# Patient Record
Sex: Female | Born: 2000
Health system: Southern US, Community
[De-identification: ages and names within clinical notes are randomized; demographics above are authoritative.]

## PROBLEM LIST (undated history)

## (undated) DIAGNOSIS — N200 Calculus of kidney: Secondary | ICD-10-CM

## (undated) DIAGNOSIS — N946 Dysmenorrhea, unspecified: Secondary | ICD-10-CM

## (undated) HISTORY — PX: NO PAST SURGERIES: SHX2092

## (undated) HISTORY — DX: Dysmenorrhea, unspecified: N94.6

## (undated) HISTORY — DX: Calculus of kidney: N20.0

---
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
## 2021-02-13 ENCOUNTER — Other Ambulatory Visit: Payer: Self-pay | Admitting: Obstetrics and Gynecology

## 2021-02-13 DIAGNOSIS — N946 Dysmenorrhea, unspecified: Secondary | ICD-10-CM

## 2021-02-13 DIAGNOSIS — Z3041 Encounter for surveillance of contraceptive pills: Secondary | ICD-10-CM

## 2021-02-13 DIAGNOSIS — N921 Excessive and frequent menstruation with irregular cycle: Secondary | ICD-10-CM

## 2021-02-13 DIAGNOSIS — Z01419 Encounter for gynecological examination (general) (routine) without abnormal findings: Secondary | ICD-10-CM

## 2021-02-15 ENCOUNTER — Other Ambulatory Visit: Payer: Self-pay

## 2021-02-15 DIAGNOSIS — Z01419 Encounter for gynecological examination (general) (routine) without abnormal findings: Secondary | ICD-10-CM

## 2021-02-15 DIAGNOSIS — N946 Dysmenorrhea, unspecified: Secondary | ICD-10-CM

## 2021-02-15 DIAGNOSIS — Z3041 Encounter for surveillance of contraceptive pills: Secondary | ICD-10-CM

## 2021-02-15 DIAGNOSIS — N921 Excessive and frequent menstruation with irregular cycle: Secondary | ICD-10-CM

## 2021-02-15 MED ORDER — JUNEL FE 24 1-20 MG-MCG(24) PO TABS: 1.0000 | ORAL_TABLET | Freq: Every day | ORAL | 0 refills | Status: DC

## 2021-02-15 NOTE — Telephone Encounter (Signed)
Mom left msg on triage requesting BC RF be sent to pharmacy, annual scheduled already. [facility] RF sent, mom aware.

## 2021-04-08 DIAGNOSIS — M7522 Bicipital tendinitis, left shoulder: Secondary | ICD-10-CM | POA: Diagnosis not present

## 2021-04-17 ENCOUNTER — Encounter: Payer: Self-pay | Admitting: Obstetrics

## 2021-04-17 ENCOUNTER — Ambulatory Visit (INDEPENDENT_AMBULATORY_CARE_PROVIDER_SITE_OTHER): Payer: BC Managed Care – PPO | Admitting: Obstetrics

## 2021-04-17 ENCOUNTER — Telehealth: Payer: Self-pay | Admitting: Family

## 2021-04-17 ENCOUNTER — Other Ambulatory Visit: Payer: Self-pay

## 2021-04-17 VITALS — BP 110/70 | Ht 61.0 in | Wt 123.4 lb

## 2021-04-17 DIAGNOSIS — R300 Dysuria: Secondary | ICD-10-CM

## 2021-04-17 DIAGNOSIS — R399 Unspecified symptoms and signs involving the genitourinary system: Secondary | ICD-10-CM

## 2021-04-17 LAB — POCT URINALYSIS DIPSTICK
Bilirubin, UA: NEGATIVE
Blood, UA: POSITIVE
Glucose, UA: NEGATIVE
Protein, UA: NEGATIVE
Spec Grav, UA: >=1.03 — AB (ref 1.010–1.025)
Urobilinogen, UA: 1 E.U./dL
pH, UA: 5 (ref 5.0–8.0)

## 2021-04-17 MED ORDER — NITROFURANTOIN MONOHYD MACRO 100 MG PO CAPS: 100.0000 mg | ORAL_CAPSULE | Freq: Two times a day (BID) | ORAL | 0 refills | Status: AC

## 2021-04-17 NOTE — Telephone Encounter (Signed)
noted 

## 2021-04-17 NOTE — Progress Notes (Signed)
Obstetrics & Gynecology Office Visit   Chief Complaint:  Chief Complaint  Patient presents with  . Gynecologic Exam    Voiding blood     History of Present Illness: Michelle Barrett presents c/o UTI symptoms. She noticed blood in her urine this morning and dysuria. She had not had much water to drink yesterday, so she drank a large amount of H2O  Before her visit. She also c/o urgency. Recently finishe d her period but her vaginal flow has been dark brown. She is on OCPs.   Review of Systems:  Review of Systems  Genitourinary: Positive for dysuria, frequency, hematuria and urgency.  All other systems reviewed and are negative.    Past Medical History:  Past Medical History:  Diagnosis Date  . Dysmenorrhea in adolescent     Past Surgical History:  Past Surgical History:  Procedure Laterality Date  . NO PAST SURGERIES      Gynecologic History: Patient's last menstrual period was 04/10/2021.  Obstetric History: G0P0000  Family History:  Family History  Problem Relation Age of Onset  . Cancer Father        Sarcoma   . Cancer Paternal Uncle        Sarcoma  . Cancer Maternal Grandfather 60       Esophageal Cancer  . Colon cancer Neg Hx   . Breast cancer Neg Hx     Social History:  Social History   Socioeconomic History  . Marital status: Single    Spouse name: Not on file  . Number of children: Not on file  . Years of education: Not on file  . Highest education level: Not on file  Occupational History  . Not on file  Tobacco Use  . Smoking status: Never Smoker  . Smokeless tobacco: Never Used  Vaping Use  . Vaping Use: Never used  Substance and Sexual Activity  . Alcohol use: No  . Drug use: No  . Sexual activity: Yes    Birth control/protection: Pill  Other Topics Concern  . 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    Social Determinants of Health   Financial Resource Strain: Not on file  Food Insecurity: Not on file  Transportation Needs: Not on file  Physical Activity: Not on file  Stress: Not on file  Social Connections: Not on file  Intimate Partner Violence: Not on file    Allergies:  Allergies  Allergen Reactions  . Other Other (See Comments)    Peanuts (stomachache)    Medications: Prior to Admission medications   Medication Sig Start Date End Date Taking? Authorizing Provider  EPIDUO FORTE 0.3-2.5 % GEL Apply 1 application topically at bedtime. 08/10/20  Yes [provider]  Norethindrone Acetate-Ethinyl Estrad-FE (JUNEL FE 24) 1-20 MG-MCG(24) tablet Take 1 tablet by mouth daily. 02/15/21  Yes Conard Novak, MD  azithromycin (ZITHROMAX) 250 MG tablet Take 2 tablets ( total 500 mg) PO on day 1, then take 1 tablet ( total 250 mg) by mouth q24h x 4 days. Patient not taking: Reported on 04/17/2021 08/24/20   Theadore Nan, NP  nitrofurantoin, macrocrystal-monohydrate, (MACROBID) 100 MG capsule Take 1 capsule (100 mg total) by mouth 2 (two)  times daily for 5 days. 04/17/21 04/22/21 Yes Mirna Mires, CNM  spironolactone (ALDACTONE) 50 MG tablet Take 50 mg by mouth 2 (two) times daily. Patient not taking: Reported on 04/17/2021 08/09/20   [provider]    Physical Exam Vitals:  Vitals:   04/17/21 1454  BP: 110/70   Patient's last menstrual period was 04/10/2021.  Physical Exam Constitutional:      Appearance: Normal appearance.  HENT:     Head: Normocephalic and atraumatic.  Cardiovascular:     Rate and Rhythm: Regular rhythm.  Pulmonary:     Effort: Pulmonary effort is normal.     Breath sounds: Normal breath sounds.  Genitourinary:    Comments: deferred Musculoskeletal:        General: Normal range of motion.     Cervical back: Normal range of motion and neck supple.   Skin:    General: Skin is warm and dry.  Neurological:     General: No focal deficit present.     Mental Status: She is alert and oriented to person, place, and time.  Psychiatric:        Mood and Affect: Mood normal.    See urine dip- Large Leuks, + nitrites, + blood, Trace ketones Assessment: 20 y.o. G0P0000 with UTI symptoms; hematuria  Plan: Problem List Items Addressed This Visit   None   Visit Diagnoses    Dysuria    -  Primary   Relevant Medications   nitrofurantoin, macrocrystal-monohydrate, (MACROBID) 100 MG capsule   Other Relevant Orders   POCT Urinalysis Dipstick (Completed)   Urine Culture   UTI symptoms       Relevant Medications   nitrofurantoin, macrocrystal-monohydrate, (MACROBID) 100 MG capsule   Other Relevant Orders   Urine Culture    Will review the culture and change antibiotics if necessary.  Mirna Mires, CNM  04/17/2021 3:22 PM

## 2021-04-17 NOTE — Telephone Encounter (Signed)
FYI Patient was seen today at her OB/GYN for dysuria.

## 2021-04-17 NOTE — Telephone Encounter (Signed)
Sent to Access Nurse   Pt called she has blood in her urine that started today  Access Nurse aware to available appts today

## 2021-04-17 NOTE — Telephone Encounter (Signed)
Just FYI have seen & waiting on triage from access nurse.

## 2021-04-19 LAB — URINE CULTURE: Organism ID, Bacteria: NO GROWTH

## 2021-04-19 NOTE — Progress Notes (Signed)
Pt seen with c/o blood in her urine,Was finishing a period when she provided a CC sample. Culture shows no growth. Will be seeing her in several days for Annual, and can discuss then. Called the patient and left message with her mother that the culture did not grow out bacteria. She will stop the [employer]. May consult with urologist if she retests again. Mirna Mires, CNM  04/19/2021 10:10 AM

## 2021-04-20 ENCOUNTER — Emergency Department
Admission: EM | Admit: 2021-04-20 | Discharge: 2021-04-20 | Disposition: A | Discharge status: Home / Self Care | Payer: BC Managed Care – PPO | Attending: Emergency Medicine | Admitting: Emergency Medicine

## 2021-04-20 ENCOUNTER — Emergency Department: Payer: BC Managed Care – PPO

## 2021-04-20 ENCOUNTER — Other Ambulatory Visit: Payer: Self-pay

## 2021-04-20 ENCOUNTER — Encounter: Payer: Self-pay | Admitting: Emergency Medicine

## 2021-04-20 DIAGNOSIS — R102 Pelvic and perineal pain: Secondary | ICD-10-CM

## 2021-04-20 DIAGNOSIS — R319 Hematuria, unspecified: Secondary | ICD-10-CM | POA: Diagnosis not present

## 2021-04-20 DIAGNOSIS — R300 Dysuria: Secondary | ICD-10-CM | POA: Diagnosis not present

## 2021-04-20 DIAGNOSIS — N3289 Other specified disorders of bladder: Secondary | ICD-10-CM | POA: Diagnosis not present

## 2021-04-20 DIAGNOSIS — R109 Unspecified abdominal pain: Secondary | ICD-10-CM | POA: Diagnosis not present

## 2021-04-20 DIAGNOSIS — N3090 Cystitis, unspecified without hematuria: Secondary | ICD-10-CM | POA: Diagnosis not present

## 2021-04-20 DIAGNOSIS — B9689 Other specified bacterial agents as the cause of diseases classified elsewhere: Secondary | ICD-10-CM | POA: Diagnosis not present

## 2021-04-20 LAB — BASIC METABOLIC PANEL
Anion gap: 9 (ref 5–15)
BUN: 15 mg/dL (ref 6–20)
CO2: 24 mmol/L (ref 22–32)
Calcium: 9.5 mg/dL (ref 8.9–10.3)
Chloride: 104 mmol/L (ref 98–111)
Creatinine, Ser: 0.9 mg/dL (ref 0.44–1.00)
GFR, Estimated: >60 mL/min (ref >60)
Glucose, Bld: 109 mg/dL — ABNORMAL HIGH (ref 70–99)
Potassium: 3.8 mmol/L (ref 3.5–5.1)
Sodium: 137 mmol/L (ref 135–145)

## 2021-04-20 LAB — URINALYSIS, COMPLETE (UACMP) WITH MICROSCOPIC
Bilirubin Urine: NEGATIVE
Glucose, UA: NEGATIVE mg/dL
Hgb urine dipstick: NEGATIVE
Ketones, ur: 5 mg/dL — AB
Leukocytes,Ua: NEGATIVE
Nitrite: NEGATIVE
Protein, ur: NEGATIVE mg/dL
Specific Gravity, Urine: 1.008 (ref 1.005–1.030)
pH: 6 (ref 5.0–8.0)

## 2021-04-20 LAB — CBC
HCT: 39.5 % (ref 36.0–46.0)
Hemoglobin: 13.3 g/dL (ref 12.0–15.0)
MCH: 30.7 pg (ref 26.0–34.0)
MCHC: 33.7 g/dL (ref 30.0–36.0)
MCV: 91.2 fL (ref 80.0–100.0)
Platelets: 301 10*3/uL (ref 150–400)
RBC: 4.33 MIL/uL (ref 3.87–5.11)
RDW: 11.8 % (ref 11.5–15.5)
WBC: 10.5 10*3/uL (ref 4.0–10.5)
nRBC: 0 % (ref 0.0–0.2)

## 2021-04-20 LAB — HCG, QUANTITATIVE, PREGNANCY: hCG, Beta Chain, Quant, S: <1 m[IU]/mL (ref <5)

## 2021-04-20 MED ORDER — SODIUM CHLORIDE 0.9 % IV SOLN
1.0000 g | Freq: Once | INTRAVENOUS | Status: AC
  Administered 2021-04-20: 1 g via INTRAVENOUS
  Filled 2021-04-20: qty 10

## 2021-04-20 MED ORDER — OXYCODONE-ACETAMINOPHEN 5-325 MG PO TABS
1.0000 | ORAL_TABLET | Freq: Once | ORAL | Status: AC
  Administered 2021-04-20: 1 via ORAL
  Filled 2021-04-20: qty 1

## 2021-04-20 MED ORDER — ONDANSETRON 4 MG PO TBDP: 4.0000 mg | ORAL_TABLET | Freq: Four times a day (QID) | ORAL | 0 refills | Status: DC | PRN

## 2021-04-20 MED ORDER — IBUPROFEN 800 MG PO TABS: 800.0000 mg | ORAL_TABLET | Freq: Three times a day (TID) | ORAL | 0 refills | Status: DC | PRN

## 2021-04-20 MED ORDER — CEPHALEXIN 500 MG PO CAPS: 500.0000 mg | ORAL_CAPSULE | Freq: Two times a day (BID) | ORAL | 0 refills | Status: DC

## 2021-04-20 MED ORDER — ONDANSETRON 4 MG PO TBDP
4.0000 mg | ORAL_TABLET | Freq: Once | ORAL | Status: AC
  Administered 2021-04-20: 4 mg via ORAL
  Filled 2021-04-20: qty 1

## 2021-04-20 NOTE — ED Provider Notes (Addendum)
[facility] Emergency Department Provider Note  ____________________________________________   Event Date/Time   First MD Initiated Contact with Patient 04/20/21 [phone]     (approximate)  I have reviewed the triage vital signs and the nursing notes.   HISTORY  Chief Complaint Flank Pain    HPI Michelle Barrett is a 20 y.o. female with no significant past medical history who presents to the emergency department with complaints of dysuria, hematuria that started June 1.  Saw her OB/GYN in the next day and was started on Macrobid for possible UTI.  Was called today after her urine culture was negative and told to stop her antibiotic.  States she took 3 pills total.  Reports dysuria, hematuria improving but still having lower abdominal pain and now severe lower back pain.  No history of kidney stones but mother does have a history of stones and they were concerned that she could be having a kidney stone today.  No known fevers.  No nausea, vomiting or diarrhea.  No abnormal vaginal bleeding or discharge.  Last menstrual period was 04/10/2021 and was normal.  She was told previously that she could have endometriosis and was started on birth control at a young age for this.  Given oxycodone in triage and pain has significantly improved.  She denies history of STDs and declines testing today.  She denies any history of injury to her back.     Past Medical History:  Diagnosis Date  . Dysmenorrhea in adolescent     Patient Active Problem List   Diagnosis Date Noted  . URI with cough and congestion 08/24/2020  . Conjunctivitis 05/02/2020  . Rash 04/28/2018  . Sports physical 04/28/2018  . Menorrhagia with irregular cycle 01/24/2018  . Sore throat 12/30/2017  . Allergic rhinitis 11/26/2017  . Fatigue 04/07/2017  . Headache 01/19/2017  . Pharyngitis 04/18/2016  . Dysmenorrhea 04/18/2016  . Routine physical examination 05/05/2015  . Multiple food allergies  05/05/2015    Past Surgical History:  Procedure Laterality Date  . NO PAST SURGERIES      Prior to Admission medications   Medication Sig Start Date End Date Taking? Authorizing Provider  cephALEXin (KEFLEX) 500 MG capsule Take 1 capsule (500 mg total) by mouth 2 (two) times daily. 04/20/21  Yes Kashmir Lysaght, Layla Maw, DO  ibuprofen (ADVIL) 800 MG tablet Take 1 tablet (800 mg total) by mouth every 8 (eight) hours as needed for mild pain. 04/20/21  Yes Leightyn Cina N, DO  ondansetron (ZOFRAN ODT) 4 MG disintegrating tablet Take 1 tablet (4 mg total) by mouth every 6 (six) hours as needed for nausea or vomiting. 04/20/21  Yes Frantz Quattrone, Layla Maw, DO  azithromycin (ZITHROMAX) 250 MG tablet Take 2 tablets ( total 500 mg) PO on day 1, then take 1 tablet ( total 250 mg) by mouth q24h x 4 days. Patient not taking: Reported on 04/17/2021 08/24/20   Theadore Nan, NP  EPIDUO FORTE 0.3-2.5 % GEL Apply 1 application topically at bedtime. 08/10/20   [provider]  nitrofurantoin, macrocrystal-monohydrate, (MACROBID) 100 MG capsule Take 1 capsule (100 mg total) by mouth 2 (two) times daily for 5 days. 04/17/21 04/22/21  Mirna Mires, CNM  Norethindrone Acetate-Ethinyl Estrad-FE (JUNEL FE 24) 1-20 MG-MCG(24) tablet Take 1 tablet by mouth daily. 02/15/21   Conard Novak, MD  spironolactone (ALDACTONE) 50 MG tablet Take 50 mg by mouth 2 (two) times daily. Patient not taking: Reported on 04/17/2021 08/09/20  [provider]    Allergies Other  Family History  Problem Relation Age of Onset  . Cancer Father        Sarcoma   . Cancer Paternal Uncle        Sarcoma  . Cancer Maternal Grandfather 63       Esophageal Cancer  . Colon cancer Neg Hx   . Breast cancer Neg Hx     Social History Social History   Tobacco Use  . Smoking status: Never Smoker  . Smokeless tobacco: Never Used  Vaping Use  . Vaping Use: Never used  Substance Use Topics  . Alcohol use: No  . Drug use: No    Review  of Systems Constitutional: No fever. Eyes: No visual changes. ENT: No sore throat. Cardiovascular: Denies chest pain. Respiratory: Denies shortness of breath. Gastrointestinal: No nausea, vomiting, diarrhea. Genitourinary:+ for dysuria. Musculoskeletal: + for back pain. Skin: Negative for rash. Neurological: Negative for focal weakness or numbness.  ____________________________________________   PHYSICAL EXAM:  VITAL SIGNS: ED Triage Vitals  Enc Vitals Group     BP 04/20/21 0202 (!) 137/92     Pulse Rate 04/20/21 0202 90     Resp 04/20/21 0202 18     Temp 04/20/21 0202 98.4 F (36.9 C)     Temp Source 04/20/21 0202 Oral     SpO2 04/20/21 0202 96 %     Weight 04/20/21 0203 123 lb 6.4 oz (56 kg)     Height 04/20/21 0203 5\' 1"  (1.549 m)     Head Circumference --      Peak Flow --      Pain Score 04/20/21 0202 6     Pain Loc --      Pain Edu? --      Excl. in GC? --    CONSTITUTIONAL: Alert and oriented and responds appropriately to questions. Well-appearing; well-nourished HEAD: Normocephalic EYES: Conjunctivae clear, pupils appear equal, EOM appear intact ENT: normal nose; moist mucous membranes NECK: Supple, normal ROM CARD: RRR; S1 and S2 appreciated; no murmurs, no clicks, no rubs, no gallops RESP: Normal chest excursion without splinting or tachypnea; breath sounds clear and equal bilaterally; no wheezes, no rhonchi, no rales, no hypoxia or respiratory distress, speaking full sentences ABD/GI: Normal bowel sounds; non-distended; soft, mildly tender in the left pelvic region, no rebound, no guarding, no peritoneal signs, no hepatosplenomegaly, negative Murphy sign, no tenderness at McBurney's point, no tenderness in the right pelvic area GU: Patient declines BACK: The back appears normal, no midline spinal tenderness or step-off or deformity, no CVA tenderness EXT: Normal ROM in all joints; no deformity noted, no edema; no cyanosis SKIN: Normal color for age and race;  warm; no rash on exposed skin NEURO: Moves all extremities equally PSYCH: The patient's mood and manner are appropriate.  ____________________________________________   LABS (all labs ordered are listed, but only abnormal results are displayed)  Labs Reviewed  URINALYSIS, COMPLETE (UACMP) WITH MICROSCOPIC - Abnormal; Notable for the following components:      Result Value   Color, Urine YELLOW (*)    APPearance HAZY (*)    Ketones, ur 5 (*)    Bacteria, UA MANY (*)    All other components within normal limits  BASIC METABOLIC PANEL - Abnormal; Notable for the following components:   Glucose, Bld 109 (*)    All other components within normal limits  URINE CULTURE  CBC  HCG, QUANTITATIVE, PREGNANCY   ____________________________________________  EKG  ____________________________________________  RADIOLOGY Normajean BaxterI, Deshea Pooley, personally viewed and evaluated these images (plain radiographs) as part of my medical decision making, as well as reviewing the written report by the radiologist.  ED MD interpretation: Cystitis.  No kidney stone.  Appendix not visualized.  Official radiology report(s): CT Renal Stone Study  Result Date: 04/20/2021 CLINICAL DATA:  Flank pain. Kidney stone suspected. Hematuria few days ago. Gyn told her that she had a UTI. EXAM: CT ABDOMEN AND PELVIS WITHOUT CONTRAST TECHNIQUE: Multidetector CT imaging of the abdomen and pelvis was performed following the standard protocol without IV contrast. COMPARISON:  None. FINDINGS: Lower chest: No acute abnormality. Hepatobiliary: No focal liver abnormality. No gallstones, gallbladder wall thickening, or pericholecystic fluid. No biliary dilatation. Pancreas: No focal lesion. Normal pancreatic contour. No surrounding inflammatory changes. No main pancreatic ductal dilatation. Spleen: Normal in size without focal abnormality. Adrenals/Urinary Tract: No adrenal nodule bilaterally. 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 No nephrolithiasis, no hydronephrosis, and no contour-deforming renal mass. No definite ureterolithiasis or hydroureter. Mild circumferential urinary bladder wall thickening. Otherwise the urinary bladder is unremarkable. Stomach/Bowel: Stomach is within normal limits. No evidence of bowel wall thickening or dilatation. The appendix is not definitely identified. Vascular/Lymphatic: Likely phlebolith noted within the left pelvis (2:55). No significant vascular findings are present. No enlarged abdominal or pelvic lymph nodes. Reproductive: Uterus and bilateral adnexa are unremarkable. Other: No intraperitoneal free fluid. No intraperitoneal free gas. No organized fluid collection. Musculoskeletal: No abdominal wall hernia or abnormality. No suspicious lytic or blastic osseous lesions. No acute displaced fracture. Multilevel degenerative changes of the spine. IMPRESSION: 1. Mild circumferential urinary bladder wall thickening. Correlate with urinalysis for infection. 2. 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 No associated obstruction or reflux identified. Electronically Signed   By: Tish FredericksonMorgane  Naveau M.D.   On: 04/20/2021 04:04    ____________________________________________   PROCEDURES  Procedure(s) performed (including Critical Care):  Procedures    ____________________________________________   INITIAL IMPRESSION / ASSESSMENT AND PLAN / ED COURSE  As part of my medical decision making, I reviewed the following data within the electronic MEDICAL RECORD NUMBER History obtained from family, Nursing notes reviewed and incorporated, Labs reviewed , Old chart reviewed, Radiograph reviewed  and Notes from prior ED visits         Patient here with complaints of dysuria, hematuria and now lower back pain.  No CVA tenderness.  Urine shows many bacteria but no other sign of infection but this is a partially treated urine.  We will send urine culture today.  Labs  reassuring.  Pregnancy test negative.  CT scan shows no kidney stone but does show mild circumferential urinary bladder wall thickening consistent with cystitis.  I suspect that she may be developing an ascending UTI.  Will give Rocephin here.  She does have some left pelvic tenderness on exam however uterus and adnexa appear unremarkable on CT imaging.  We discussed that this could be endometriosis.  Have offered pelvic exam, STD screening today which she declines.  We discussed the possibility of ovarian cyst, torsion.  Family and patient comfortable with proceeding with transvaginal ultrasound.  She declines any further pain medicine at this time stating oxycodone in triage helped her significantly.  ED PROGRESS  Transvaginal ultrasound pending.  Signed out the oncoming ED physician.  Anticipate discharge home if no significant abnormality.  Will discharge with Keflex, ibuprofen, Zofran.   I reviewed all nursing notes and pertinent previous records as available.  I have reviewed and interpreted any EKGs, lab and urine results, imaging (as available).  ____________________________________________   FINAL CLINICAL IMPRESSION(S) / ED DIAGNOSES  Final diagnoses:  Pelvic pain  Cystitis     ED Discharge Orders         Ordered    cephALEXin (KEFLEX) 500 MG capsule  2 times daily        04/20/21 0712    ibuprofen (ADVIL) 800 MG tablet  Every 8 hours PRN        04/20/21 0712    ondansetron (ZOFRAN ODT) 4 MG disintegrating tablet  Every 6 hours PRN        04/20/21 8546          *Please note:  Maxwell Caul was evaluated in Emergency Department on 04/20/2021 for the symptoms described in the history of present illness. She was evaluated in the context of the global COVID-19 pandemic, which necessitated consideration that the patient might be at risk for infection with the SARS-CoV-2 virus that causes COVID-19. Institutional protocols and algorithms that pertain to the evaluation of  patients at risk for COVID-19 are in a state of rapid change based on information released by regulatory bodies including the CDC and federal and state organizations. These policies and algorithms were followed during the patient's care in the ED.  Some ED evaluations and interventions may be delayed as a result of limited staffing during and the pandemic.*   Note:  This document was prepared using Dragon voice recognition software and may include unintentional dictation errors.   Genoa Freyre, Layla Maw, DO 04/20/21 0720    Berle Fitz, Layla Maw, DO 04/20/21 2703

## 2021-04-20 NOTE — Discharge Instructions (Signed)
Your labs today were normal.  Your urine did show bacteria but no other sign of infection but this could be because you were recently on antibiotics.  Your CT scan was suggestive of inflammation of your bladder likely due to a urinary tract infection.  We did not see any kidney stones today.  We have sent a prescription of antibiotics to your pharmacy to take for the next week.  You may alternate Tylenol 1000 mg every 6 hours as needed for pain, fever and Ibuprofen 800 mg every 8 hours as needed for pain, fever.  Please take Ibuprofen with food.  Do not take more than 4000 mg of Tylenol (acetaminophen) in a 24 hour period.

## 2021-04-20 NOTE — ED Notes (Signed)
Ed room 25 dispo signature pad not working, pt unable to sign

## 2021-04-20 NOTE — ED Triage Notes (Signed)
Pt reports that she thought she had a UTI, she urinated blood a few days ago and went to her GYN and they told her that she had an UTI, they put her on antibiotics. They called her back and told her to stop taking the antibiotic because she did not have a UTI. She continues to have left sided flank pain and thinks she may have a kidney stone

## 2021-04-22 ENCOUNTER — Ambulatory Visit: Payer: BC Managed Care – PPO | Admitting: Obstetrics and Gynecology

## 2021-04-22 LAB — URINE CULTURE

## 2021-05-01 ENCOUNTER — Other Ambulatory Visit: Payer: Self-pay

## 2021-05-01 DIAGNOSIS — Z01419 Encounter for gynecological examination (general) (routine) without abnormal findings: Secondary | ICD-10-CM

## 2021-05-01 DIAGNOSIS — N946 Dysmenorrhea, unspecified: Secondary | ICD-10-CM

## 2021-05-01 DIAGNOSIS — N921 Excessive and frequent menstruation with irregular cycle: Secondary | ICD-10-CM

## 2021-05-01 DIAGNOSIS — Z3041 Encounter for surveillance of contraceptive pills: Secondary | ICD-10-CM

## 2021-05-01 MED ORDER — JUNEL FE 24 1-20 MG-MCG(24) PO TABS: 1.0000 | ORAL_TABLET | Freq: Every day | ORAL | 0 refills | Status: DC

## 2021-05-01 NOTE — Telephone Encounter (Signed)
Pt's mom calling; pt has scheduled appt 7/5 and needs bc refill 'til then.  [phone] Pt aware refill eRx'd.

## 2021-05-10 DIAGNOSIS — D2262 Melanocytic nevi of left upper limb, including shoulder: Secondary | ICD-10-CM | POA: Diagnosis not present

## 2021-05-10 DIAGNOSIS — L700 Acne vulgaris: Secondary | ICD-10-CM | POA: Diagnosis not present

## 2021-05-10 DIAGNOSIS — D2261 Melanocytic nevi of right upper limb, including shoulder: Secondary | ICD-10-CM | POA: Diagnosis not present

## 2021-05-10 DIAGNOSIS — D225 Melanocytic nevi of trunk: Secondary | ICD-10-CM | POA: Diagnosis not present

## 2021-05-14 ENCOUNTER — Ambulatory Visit: Payer: Self-pay | Admitting: Obstetrics and Gynecology

## 2021-05-15 ENCOUNTER — Ambulatory Visit: Payer: Self-pay | Admitting: Obstetrics and Gynecology

## 2021-05-21 ENCOUNTER — Other Ambulatory Visit: Payer: Self-pay

## 2021-05-21 ENCOUNTER — Ambulatory Visit (INDEPENDENT_AMBULATORY_CARE_PROVIDER_SITE_OTHER): Payer: BC Managed Care – PPO | Admitting: Obstetrics

## 2021-05-21 ENCOUNTER — Encounter: Payer: Self-pay | Admitting: Obstetrics

## 2021-05-21 VITALS — BP 100/64 | HR 64 | Ht 61.0 in | Wt 120.0 lb

## 2021-05-21 DIAGNOSIS — Z01419 Encounter for gynecological examination (general) (routine) without abnormal findings: Secondary | ICD-10-CM

## 2021-05-21 DIAGNOSIS — N921 Excessive and frequent menstruation with irregular cycle: Secondary | ICD-10-CM | POA: Diagnosis not present

## 2021-05-21 DIAGNOSIS — N946 Dysmenorrhea, unspecified: Secondary | ICD-10-CM

## 2021-05-21 DIAGNOSIS — Z3041 Encounter for surveillance of contraceptive pills: Secondary | ICD-10-CM | POA: Diagnosis not present

## 2021-05-21 MED ORDER — JUNEL FE 24 1-20 MG-MCG(24) PO TABS: 1.0000 | ORAL_TABLET | Freq: Every day | ORAL | 3 refills | Status: DC

## 2021-05-21 MED ORDER — IBUPROFEN 800 MG PO TABS: 800.0000 mg | ORAL_TABLET | Freq: Three times a day (TID) | ORAL | 6 refills | Status: DC | PRN

## 2021-05-21 NOTE — Progress Notes (Signed)
Gynecology Annual Exam  PCP: Allegra Grana, FNP  Chief Complaint:  Chief Complaint  Patient presents with   Annual Exam    History of Present Illness: Patient is a 20 y.o. G0P0000 presents for annual exam. The patient has no complaints today. She is a Consulting civil engineer at [employer]. Not presently sexually active. Her mother told her she needed a pap smear. Her mother is also opposed tp her receiving Gardasil.  LMP: Patient's last menstrual period was 05/08/2021. Menarche:13 Average Interval: regular, 28 days Duration of flow: 4 days Heavy Menses: no Clots: no Intermenstrual Bleeding: no Postcoital Bleeding: no Dysmenorrhea: yes  The patient is not currently sexually active. She currently uses OCP (estrogen/progesterone) for contraception. She denies dyspareunia.  The patient does not perform self breast exams.  There is no notable family history of breast or ovarian cancer in her family.  The patient wears seatbelts: yes.  The patient has regular exercise: yes.    The patient denies current symptoms of depression.    Review of Systems: Review of Systems  Constitutional: Negative.   HENT: Negative.    Eyes: Negative.   Respiratory: Negative.    Cardiovascular: Negative.   Gastrointestinal: Negative.   Genitourinary: Negative.   Musculoskeletal: Negative.   Skin: Negative.   Neurological: Negative.   Endo/Heme/Allergies: Negative.   Psychiatric/Behavioral: Negative.     Past Medical History:  Patient Active Problem List   Diagnosis Date Noted   URI with cough and congestion 08/24/2020   Conjunctivitis 05/02/2020   Rash 04/28/2018   Sports physical 04/28/2018   Menorrhagia with irregular cycle 01/24/2018   Sore throat 12/30/2017   Allergic rhinitis 11/26/2017   Fatigue 04/07/2017   Headache 01/19/2017   Pharyngitis 04/18/2016   Dysmenorrhea 04/18/2016   Routine physical examination 05/05/2015   Multiple food allergies 05/05/2015    Past Surgical History:  Past  Surgical History:  Procedure Laterality Date   NO PAST SURGERIES      Gynecologic History:  Patient's last menstrual period was 05/08/2021. Contraception: OCP (estrogen/progesterone) Last Pap: Results were: NA    Obstetric History: G0P0000  Family History:  Family History  Problem Relation Age of Onset   Cancer Father        Sarcoma    Cancer Paternal Uncle        Sarcoma   Cancer Maternal Grandfather 70       Esophageal Cancer   Colon cancer Neg Hx    Breast cancer Neg Hx     Social History:  Social History   Socioeconomic History   Marital status: Single    Spouse name: Not on file   Number of children: Not on file   Years of education: Not on file   Highest education level: Not on file  Occupational History   Not on file  Tobacco Use   Smoking status: Never   Smokeless tobacco: Never  Vaping Use   Vaping Use: Never used  Substance and Sexual Activity   Alcohol use: No   Drug use: No   Sexual activity: Yes    Birth control/protection: Pill  Other Topics Concern   Not on file  Social History Narrative   Enjoys cheering on 2 squads and school cheerleading    Right handed   Caffeine- no soda, tea or coffee occasionally          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    Social Determinants  of Health   Financial Resource Strain: Not on file  Food Insecurity: Not on file  Transportation Needs: Not on file  Physical Activity: Not on file  Stress: Not on file  Social Connections: Not on file  Intimate Partner Violence: Not on file    Allergies:  Allergies  Allergen Reactions   Other Other (See Comments)    Peanuts (stomachache)    Medications: Prior to Admission medications   Medication Sig Start Date End Date Taking? Authorizing Provider  EPIDUO FORTE 0.3-2.5 % GEL Apply 1 application topically at bedtime. 08/10/20  Yes [provider]  Norethindrone Acetate-Ethinyl Estrad-FE (JUNEL FE  24) 1-20 MG-MCG(24) tablet Take 1 tablet by mouth daily. 05/01/21  Yes Mirna Mires, CNM  cephALEXin (KEFLEX) 500 MG capsule Take 1 capsule (500 mg total) by mouth 2 (two) times daily. 04/20/21   Ward, Layla Maw, DO  ibuprofen (ADVIL) 800 MG tablet Take 1 tablet (800 mg total) by mouth every 8 (eight) hours as needed for mild pain. Patient not taking: Reported on 05/21/2021 04/20/21   Ward, Layla Maw, DO  ondansetron (ZOFRAN ODT) 4 MG disintegrating tablet Take 1 tablet (4 mg total) by mouth every 6 (six) hours as needed for nausea or vomiting. Patient not taking: Reported on 05/21/2021 04/20/21   Ward, Layla Maw, DO  spironolactone (ALDACTONE) 50 MG tablet Take 50 mg by mouth 2 (two) times daily. 08/09/20   [provider]    Physical Exam Vitals: Blood pressure 100/64, pulse 64, height 5\' 1"  (1.549 m), weight 120 lb (54.4 kg), last menstrual period 05/08/2021.  General: NAD HEENT: normocephalic, anicteric Thyroid: no enlargement, no palpable nodules Pulmonary: No increased work of breathing, CTAB Cardiovascular: RRR, distal pulses 2+ Breast: Breast symmetrical, no tenderness, no palpable nodules or masses, no skin or nipple retraction present, no nipple discharge.  No axillary or supraclavicular lymphadenopathy. Abdomen: NABS, soft, non-tender, non-distended.  Umbilicus without lesions.  No hepatomegaly, splenomegaly or masses palpable. No evidence of hernia  Genitourinary:  External: Normal external female genitalia.  Normal urethral meatus, normal deferred Extremities: no edema, erythema, or tenderness Neurologic: Grossly intact Psychiatric: mood appropriate, affect full  Female chaperone present for pelvic and breast  portions of the physical exam    Assessment: 20 y.o. G0P0000 routine annual exam Renewal of contraception. dysmenorrhea  Plan: Problem List Items Addressed This Visit   None Visit Diagnoses     Women's annual routine gynecological examination    -  Primary        1) 4) Gardasil Series discussed and if applicable offered to patient - Patient has not previously completed 3 shot series   2) STI screening  wasoffered and declined  3)  ASCCP guidelines and rational discussed.  Patient opts for  starting at age 24  screening interval  4) Contraception - the patient is currently using  OCP (estrogen/progesterone).  She is  accepting of this method, but would consider an IUD.  We discussed safe sex practices to reduce her furture risk of STI's.   Content to use the OCPs for birth control, but  still c/o dysmenorrhea. We discussed her having a trial of using active pille and skipping the placebos, and also taking Motrin every 6-8 hours over the first two days of her period. Should she not see an improvement in her cramping, then we would consider a n IUD. 5) Return in about 1 year (around 05/21/2022) for annual.   07/22/2022, CNM  05/21/2021 8:20 AM  [facility], [facility] 05/21/2021, 8:20 AM

## 2021-08-16 DIAGNOSIS — J069 Acute upper respiratory infection, unspecified: Secondary | ICD-10-CM | POA: Diagnosis not present

## 2021-08-16 DIAGNOSIS — R070 Pain in throat: Secondary | ICD-10-CM | POA: Diagnosis not present

## 2021-10-25 ENCOUNTER — Telehealth (INDEPENDENT_AMBULATORY_CARE_PROVIDER_SITE_OTHER): Payer: BC Managed Care – PPO | Admitting: Family

## 2021-10-25 ENCOUNTER — Other Ambulatory Visit: Payer: Self-pay

## 2021-10-25 ENCOUNTER — Encounter: Payer: Self-pay | Admitting: Family

## 2021-10-25 VITALS — Ht 61.5 in | Wt 128.0 lb

## 2021-10-25 DIAGNOSIS — F419 Anxiety disorder, unspecified: Secondary | ICD-10-CM

## 2021-10-25 MED ORDER — BUSPIRONE HCL 5 MG PO TABS: 5.0000 mg | ORAL_TABLET | Freq: Three times a day (TID) | ORAL | 1 refills | Status: DC | PRN

## 2021-10-25 NOTE — Assessment & Plan Note (Signed)
New, uncontrolled.  Occurs around testing in school.  Denies depression.  Agreed starting as needed medication to take prior to testing is appropriate.  I provided her with BuSpar 5 mg 3 times daily as needed.  She will let me know how she is doing

## 2021-10-25 NOTE — Patient Instructions (Signed)
Start BuSpar 5 mg to be taken 3 times a day as needed for anxiety for prior to test.  Please let me know if you need a letter from the office.  Generalized Anxiety Disorder, Adult Generalized anxiety disorder (GAD) is a mental health condition. Unlike normal worries, anxiety related to GAD is not triggered by a specific event. These worries do not fade or get better with time. GAD interferes with relationships, work, and school. GAD symptoms can vary from mild to severe. People with severe GAD can have intense waves of anxiety with physical symptoms that are similar to panic attacks. What are the causes? The exact cause of GAD is not known, but the following are believed to have an impact: Differences in natural brain chemicals. Genes passed down from parents to children. Differences in the way threats are perceived. Development and stress during childhood. Personality. What increases the risk? The following factors may make you more likely to develop this condition: Being female. Having a family history of anxiety disorders. Being very shy. Experiencing very stressful life events, such as the death of a loved one. Having a very stressful family environment. What are the signs or symptoms? People with GAD often worry excessively about many things in their lives, such as their health and family. Symptoms may also include: Mental and emotional symptoms: Worrying excessively about natural disasters. Fear of being late. Difficulty concentrating. Fears that others are judging your performance. Physical symptoms: Fatigue. Headaches, muscle tension, muscle twitches, trembling, or feeling shaky. Feeling like your heart is pounding or beating very fast. Feeling out of breath or like you cannot take a deep breath. Having trouble falling asleep or staying asleep, or experiencing restlessness. Sweating. Nausea, diarrhea, or irritable bowel syndrome (IBS). Behavioral symptoms: Experiencing  erratic moods or irritability. Avoidance of new situations. Avoidance of people. Extreme difficulty making decisions. How is this diagnosed? This condition is diagnosed based on your symptoms and medical history. You will also have a physical exam. Your health care provider may perform tests to rule out other possible causes of your symptoms. To be diagnosed with GAD, a person must have anxiety that: Is out of his or her control. Affects several different aspects of his or her life, such as work and relationships. Causes distress that makes him or her unable to take part in normal activities. Includes at least three symptoms of GAD, such as restlessness, fatigue, trouble concentrating, irritability, muscle tension, or sleep problems. Before your health care provider can confirm a diagnosis of GAD, these symptoms must be present more days than they are not, and they must last for 6 months or longer. How is this treated? This condition may be treated with: Medicine. Antidepressant medicine is usually prescribed for long-term daily control. Anti-anxiety medicines may be added in severe cases, especially when panic attacks occur. Talk therapy (psychotherapy). Certain types of talk therapy can be helpful in treating GAD by providing support, education, and guidance. Options include: Cognitive behavioral therapy (CBT). People learn coping skills and self-calming techniques to ease their physical symptoms. They learn to identify unrealistic thoughts and behaviors and to replace them with more appropriate thoughts and behaviors. Acceptance and commitment therapy (ACT). This treatment teaches people how to be mindful as a way to cope with unwanted thoughts and feelings. Biofeedback. This process trains you to manage your body's response (physiological response) through breathing techniques and relaxation methods. You will work with a therapist while machines are used to monitor your physical  symptoms. Stress management  techniques. These include yoga, meditation, and exercise. A mental health specialist can help determine which treatment is best for you. Some people see improvement with one type of therapy. However, other people require a combination of therapies. Follow these instructions at home: Lifestyle Maintain a consistent routine and schedule. Anticipate stressful situations. Create a plan and allow extra time to work with your plan. Practice stress management or self-calming techniques that you have learned from your therapist or your health care provider. Exercise regularly and spend time outdoors. Eat a healthy diet that includes plenty of vegetables, fruits, whole grains, low-fat dairy products, and lean protein. Do not eat a lot of foods that are high in fat, added sugar, or salt (sodium). Drink plenty of water. Avoid alcohol. Alcohol can increase anxiety. Avoid caffeine and certain over-the-counter cold medicines. These may make you feel worse. Ask your pharmacist which medicines to avoid. General instructions Take over-the-counter and prescription medicines only as told by your health care provider. Understand that you are likely to have setbacks. Accept this and be kind to yourself as you persist to take better care of yourself. Anticipate stressful situations. Create a plan and allow extra time to work with your plan. Recognize and accept your accomplishments, even if you judge them as small. Spend time with people who care about you. Keep all follow-up visits. This is important. Where to find more information [employer] of Mental Health: http://www.maynard.net/ Substance Abuse and Mental Health Services: SkateOasis.com.pt Contact a health care provider if: Your symptoms do not get better. Your symptoms get worse. You have signs of depression, such as: A persistently sad or irritable mood. Loss of enjoyment in activities that used to bring you joy. Change in  weight or eating. Changes in sleeping habits. Get help right away if: You have thoughts about hurting yourself or others. If you ever feel like you may hurt yourself or others, or have thoughts about taking your own life, get help right away. Go to your nearest emergency department or: Call your local emergency services (911 in the [place] Call a suicide crisis helpline, such as the National Suicide Prevention Lifeline at (843) 448-1325 or 988 in the [place] This is open 24 hours a day in the [place] Text the Crisis Text Line at (737)773-3364 (in the [place] Summary Generalized anxiety disorder (GAD) is a mental health condition that involves worry that is not triggered by a specific event. People with GAD often worry excessively about many things in their lives, such as their health and family. GAD may cause symptoms such as restlessness, trouble concentrating, sleep problems, frequent sweating, nausea, diarrhea, headaches, and trembling or muscle twitching. A mental health specialist can help determine which treatment is best for you. Some people see improvement with one type of therapy. However, other people require a combination of therapies. This information is not intended to replace advice given to you by your health care provider. Make sure you discuss any questions you have with your health care provider. Document Revised: 05/29/2021 Document Reviewed: 02/24/2021 Elsevier Patient Education  2022 [employer].

## 2021-10-25 NOTE — Progress Notes (Signed)
Virtual Visit via Video Note  I connected with@  on 10/25/21 at  2:00 PM EST by a video enabled telemedicine application and verified that I am speaking with the correct person using two identifiers.  Location patient: home Location provider:work  Persons participating in the virtual visit: patient, provider  I discussed the limitations of evaluation and management by telemedicine and the availability of in person appointments. The patient expressed understanding and agreed to proceed.   HPI: Acute visit  Complains of increased anxiety over the past couple of months, unchanged.  Worse when she has to go to testing center for tests and when testing in person . She is not feeling anxiety other times. She describes not being to focus,  hands are shaking when taking test.  She at [employer] , sophomore. Major is business and she is doing well in school.  Lives with roommates and no stress at home.  No depression. No si/hi Never been on medication for anxiety  She is working with disability center at school to move out of testing center.    ROS: See pertinent positives and negatives per HPI.    EXAM:  VITALS per patient if applicable: Ht 5' 1.5" (1.562 m)   Wt 128 lb (58.1 kg)   BMI 23.79 kg/m  BP Readings from Last 3 Encounters:  05/21/21 100/64  04/20/21 (!) 97/58  04/17/21 110/70   Wt Readings from Last 3 Encounters:  10/25/21 128 lb (58.1 kg) (51 %, Z= 0.01)*  05/21/21 120 lb (54.4 kg) (36 %, Z= -0.35)*  04/20/21 123 lb 6.4 oz (56 kg) (44 %, Z= -0.16)*   * Growth percentiles are based on CDC (Girls, 2-20 Years) data.    GENERAL: alert, oriented, appears well and in no acute distress  HEENT: atraumatic, conjunttiva clear, no obvious abnormalities on inspection of external nose and ears  NECK: normal movements of the head and neck  LUNGS: on inspection no signs of respiratory distress, breathing rate appears normal, no obvious gross SOB, gasping or wheezing  CV: no  obvious cyanosis  MS: moves all visible extremities without noticeable abnormality  PSYCH/NEURO: pleasant and cooperative, no obvious depression or anxiety, speech and thought processing grossly intact  ASSESSMENT AND PLAN:  Discussed the following assessment and plan:  Problem List Items Addressed This Visit       Other   Anxiety - Primary    New, uncontrolled.  Occurs around testing in school.  Denies depression.  Agreed starting as needed medication to take prior to testing is appropriate.  I provided her with BuSpar 5 mg 3 times daily as needed.  She will let me know how she is doing      Relevant Medications   busPIRone (BUSPAR) 5 MG tablet    -we discussed possible serious and likely etiologies, options for evaluation and workup, limitations of telemedicine visit vs in person visit, treatment, treatment risks and precautions. Pt prefers to treat via telemedicine empirically rather then risking or undertaking an in person visit at this moment.  .   I discussed the assessment and treatment plan with the patient. The patient was provided an opportunity to ask questions and all were answered. The patient agreed with the plan and demonstrated an understanding of the instructions.   The patient was advised to call back or seek an in-person evaluation if the symptoms worsen or if the condition fails to improve as anticipated.   Rennie Plowman, FNP

## 2021-11-22 ENCOUNTER — Telehealth (INDEPENDENT_AMBULATORY_CARE_PROVIDER_SITE_OTHER): Payer: BC Managed Care – PPO | Admitting: Family

## 2021-11-22 ENCOUNTER — Encounter: Payer: Self-pay | Admitting: Family

## 2021-11-22 VITALS — Ht 61.5 in

## 2021-11-22 DIAGNOSIS — J0100 Acute maxillary sinusitis, unspecified: Secondary | ICD-10-CM | POA: Diagnosis not present

## 2021-11-22 DIAGNOSIS — J329 Chronic sinusitis, unspecified: Secondary | ICD-10-CM | POA: Insufficient documentation

## 2021-11-22 MED ORDER — AMOXICILLIN-POT CLAVULANATE 875-125 MG PO TABS: 1.0000 | ORAL_TABLET | Freq: Two times a day (BID) | ORAL | 0 refills | Status: AC

## 2021-11-22 NOTE — Progress Notes (Signed)
Virtual Visit via Video Note  I connected with@  on 11/22/21 at  8:00 AM EST by a video enabled telemedicine application and verified that I am speaking with the correct person using two identifiers.  Location patient: home Location provider:work  Persons participating in the virtual visit: patient, provider  I discussed the limitations of evaluation and management by telemedicine and the availability of in person appointments. The patient expressed understanding and agreed to proceed.   HPI:  Complains of green thick nasal congestion, facial pressure x 2 weeks, improved.  One week ago endorses pressure in face  She was seen by dentist 2 days ago as bilateral sides of jaw were hurting, since improved. Endorses dull bilateral temporal HA. Not worse HA of life.  Reports no concern for oral infection and advised to see orthodontist as concerned retainer may be contributory . She had had muffled sensation in ears, since resolved.  She has never had fever during illness. No chills, vision changes, ear pain, ear drainage, sob, wheezing.   She is taking mucinex d , elderberry and zinc. Prn tylenol and advil with some relief.   Covid negative 3 days ago.   Recent travel overseas to [place] from 11/11/21-11/19/21.  She suspected she had flu due to myalgias at that time approx 2 weeks ago.   She didn't sleep well while sleeping and worried about sinus infection.     ROS: See pertinent positives and negatives per HPI.    EXAM:  VITALS per patient if applicable: Ht 5' 1.5" (1.562 m)    BMI 23.79 kg/m  BP Readings from Last 3 Encounters:  05/21/21 100/64  04/20/21 (!) 97/58  04/17/21 110/70   Wt Readings from Last 3 Encounters:  10/25/21 128 lb (58.1 kg) (51 %, Z= 0.01)*  05/21/21 120 lb (54.4 kg) (36 %, Z= -0.35)*  04/20/21 123 lb 6.4 oz (56 kg) (44 %, Z= -0.16)*   * Growth percentiles are based on CDC (Girls, 2-20 Years) data.    GENERAL: alert, oriented, appears well and in  no acute distress  HEENT: atraumatic, conjunttiva clear, no obvious abnormalities on inspection of external nose and ears  NECK: normal movements of the head and neck  LUNGS: on inspection no signs of respiratory distress, breathing rate appears normal, no obvious gross SOB, gasping or wheezing  CV: no obvious cyanosis  MS: moves all visible extremities without noticeable abnormality  PSYCH/NEURO: pleasant and cooperative, no obvious depression or anxiety, speech and thought processing grossly intact  ASSESSMENT AND PLAN:  Discussed the following assessment and plan:  Problem List Items Addressed This Visit       Respiratory   Sinusitis - Primary    Afebrile.  No acute respiratory distress.  Duration of symptoms 2 weeks.  Discussed likely viral initially it is likely developed secondary bacterial infection.  Advise reasonable to go ahead and start Augmentin.  Advise probiotics.  She verbalized understanding of all and will let me know how she is doing      Relevant Medications   pseudoephedrine-guaifenesin (MUCINEX D) 60-600 MG 12 hr tablet   amoxicillin-clavulanate (AUGMENTIN) 875-125 MG tablet    -we discussed possible serious and likely etiologies, options for evaluation and workup, limitations of telemedicine visit vs in person visit, treatment, treatment risks and precautions. Pt prefers to treat via telemedicine empirically rather then risking or undertaking an in person visit at this moment.  .   I discussed the assessment and treatment plan with the patient. The patient  was provided an opportunity to ask questions and all were answered. The patient agreed with the plan and demonstrated an understanding of the instructions.   The patient was advised to call back or seek an in-person evaluation if the symptoms worsen or if the condition fails to improve as anticipated.   Mable Paris, FNP

## 2021-11-22 NOTE — Patient Instructions (Signed)
Start augmentin Ensure to take probiotics while on antibiotics and also for 2 weeks after completion. This can either be by eating yogurt daily or taking a probiotic supplement over the counter such as Culturelle.It is important to re-colonize the gut with good bacteria and also to prevent any diarrheal infections associated with antibiotic use.   Nice to see you!   

## 2021-11-22 NOTE — Assessment & Plan Note (Signed)
Afebrile.  No acute respiratory distress.  Duration of symptoms 2 weeks.  Discussed likely viral initially it is likely developed secondary bacterial infection.  Advise reasonable to go ahead and start Augmentin.  Advise probiotics.  She verbalized understanding of all and will let me know how she is doing

## 2021-12-05 ENCOUNTER — Encounter: Payer: Self-pay | Admitting: Family

## 2021-12-16 NOTE — Telephone Encounter (Signed)
Pt mom is calling about previous my chart message sent. Pt need letter ASAP

## 2022-02-06 DIAGNOSIS — L700 Acne vulgaris: Secondary | ICD-10-CM | POA: Diagnosis not present

## 2022-04-30 DIAGNOSIS — J029 Acute pharyngitis, unspecified: Secondary | ICD-10-CM | POA: Diagnosis not present

## 2022-06-07 DIAGNOSIS — Z6822 Body mass index (BMI) 22.0-22.9, adult: Secondary | ICD-10-CM | POA: Diagnosis not present

## 2022-06-07 DIAGNOSIS — J020 Streptococcal pharyngitis: Secondary | ICD-10-CM | POA: Diagnosis not present

## 2022-06-26 DIAGNOSIS — M47817 Spondylosis without myelopathy or radiculopathy, lumbosacral region: Secondary | ICD-10-CM | POA: Diagnosis not present

## 2022-06-26 DIAGNOSIS — M2550 Pain in unspecified joint: Secondary | ICD-10-CM | POA: Diagnosis not present

## 2022-06-26 DIAGNOSIS — M9903 Segmental and somatic dysfunction of lumbar region: Secondary | ICD-10-CM | POA: Diagnosis not present

## 2022-06-26 DIAGNOSIS — M545 Low back pain, unspecified: Secondary | ICD-10-CM | POA: Diagnosis not present

## 2022-06-27 ENCOUNTER — Other Ambulatory Visit: Payer: Self-pay | Admitting: Obstetrics

## 2022-06-30 DIAGNOSIS — M2550 Pain in unspecified joint: Secondary | ICD-10-CM | POA: Diagnosis not present

## 2022-06-30 DIAGNOSIS — M545 Low back pain, unspecified: Secondary | ICD-10-CM | POA: Diagnosis not present

## 2022-06-30 DIAGNOSIS — M47817 Spondylosis without myelopathy or radiculopathy, lumbosacral region: Secondary | ICD-10-CM | POA: Diagnosis not present

## 2022-06-30 DIAGNOSIS — M9903 Segmental and somatic dysfunction of lumbar region: Secondary | ICD-10-CM | POA: Diagnosis not present

## 2022-07-01 DIAGNOSIS — M9903 Segmental and somatic dysfunction of lumbar region: Secondary | ICD-10-CM | POA: Diagnosis not present

## 2022-07-01 DIAGNOSIS — M545 Low back pain, unspecified: Secondary | ICD-10-CM | POA: Diagnosis not present

## 2022-07-01 DIAGNOSIS — M47817 Spondylosis without myelopathy or radiculopathy, lumbosacral region: Secondary | ICD-10-CM | POA: Diagnosis not present

## 2022-07-01 DIAGNOSIS — M2550 Pain in unspecified joint: Secondary | ICD-10-CM | POA: Diagnosis not present

## 2022-07-02 DIAGNOSIS — M545 Low back pain, unspecified: Secondary | ICD-10-CM | POA: Diagnosis not present

## 2022-07-02 DIAGNOSIS — M2550 Pain in unspecified joint: Secondary | ICD-10-CM | POA: Diagnosis not present

## 2022-07-02 DIAGNOSIS — M9903 Segmental and somatic dysfunction of lumbar region: Secondary | ICD-10-CM | POA: Diagnosis not present

## 2022-07-02 DIAGNOSIS — M47817 Spondylosis without myelopathy or radiculopathy, lumbosacral region: Secondary | ICD-10-CM | POA: Diagnosis not present

## 2022-07-07 DIAGNOSIS — M47817 Spondylosis without myelopathy or radiculopathy, lumbosacral region: Secondary | ICD-10-CM | POA: Diagnosis not present

## 2022-07-07 DIAGNOSIS — M9903 Segmental and somatic dysfunction of lumbar region: Secondary | ICD-10-CM | POA: Diagnosis not present

## 2022-07-07 DIAGNOSIS — M545 Low back pain, unspecified: Secondary | ICD-10-CM | POA: Diagnosis not present

## 2022-07-07 DIAGNOSIS — M2550 Pain in unspecified joint: Secondary | ICD-10-CM | POA: Diagnosis not present

## 2022-07-08 DIAGNOSIS — M5459 Other low back pain: Secondary | ICD-10-CM | POA: Diagnosis not present

## 2022-07-08 DIAGNOSIS — S39012A Strain of muscle, fascia and tendon of lower back, initial encounter: Secondary | ICD-10-CM | POA: Diagnosis not present

## 2022-08-13 DIAGNOSIS — Z0000 Encounter for general adult medical examination without abnormal findings: Secondary | ICD-10-CM | POA: Diagnosis not present

## 2022-08-29 DIAGNOSIS — D485 Neoplasm of uncertain behavior of skin: Secondary | ICD-10-CM | POA: Diagnosis not present

## 2022-08-29 DIAGNOSIS — D225 Melanocytic nevi of trunk: Secondary | ICD-10-CM | POA: Diagnosis not present

## 2022-09-17 DIAGNOSIS — D2370 Other benign neoplasm of skin of unspecified lower limb, including hip: Secondary | ICD-10-CM | POA: Diagnosis not present

## 2022-10-01 DIAGNOSIS — Z113 Encounter for screening for infections with a predominantly sexual mode of transmission: Secondary | ICD-10-CM | POA: Diagnosis not present

## 2022-10-01 DIAGNOSIS — N946 Dysmenorrhea, unspecified: Secondary | ICD-10-CM | POA: Diagnosis not present

## 2022-11-05 DIAGNOSIS — D2261 Melanocytic nevi of right upper limb, including shoulder: Secondary | ICD-10-CM | POA: Diagnosis not present

## 2022-11-05 DIAGNOSIS — L700 Acne vulgaris: Secondary | ICD-10-CM | POA: Diagnosis not present

## 2022-11-05 DIAGNOSIS — D2262 Melanocytic nevi of left upper limb, including shoulder: Secondary | ICD-10-CM | POA: Diagnosis not present

## 2022-11-05 DIAGNOSIS — D225 Melanocytic nevi of trunk: Secondary | ICD-10-CM | POA: Diagnosis not present

## 2023-06-01 IMAGING — CT CT RENAL STONE PROTOCOL
2 of 4 series · 16 of 46 positions shown, 18 images · non-contrast
Comparison: None.

CLINICAL DATA: Flank pain. Kidney stone suspected. Hematuria few
days ago. Gyn told her that she had a UTI.

EXAM:
CT ABDOMEN AND PELVIS WITHOUT CONTRAST
TECHNIQUE: Multidetector CT imaging of the abdomen and pelvis was performed
following the standard protocol without IV contrast.

[Series 2: stone full standard · axial · 0.83mm/px · z∈[-916,-530]mm · 13 of 85 slices shown, 15 images]
[im 4/85  soft-tissue]
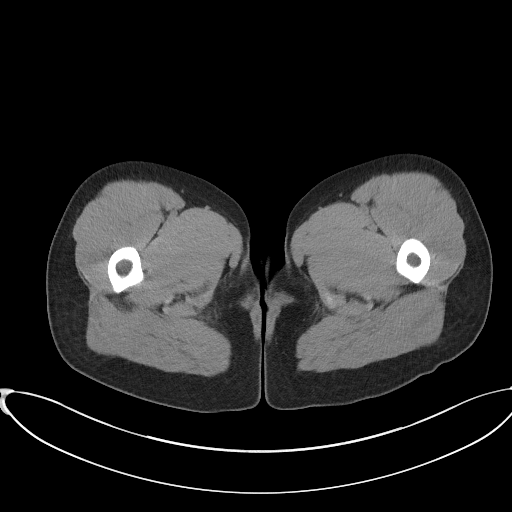
[im 4/85  bone]
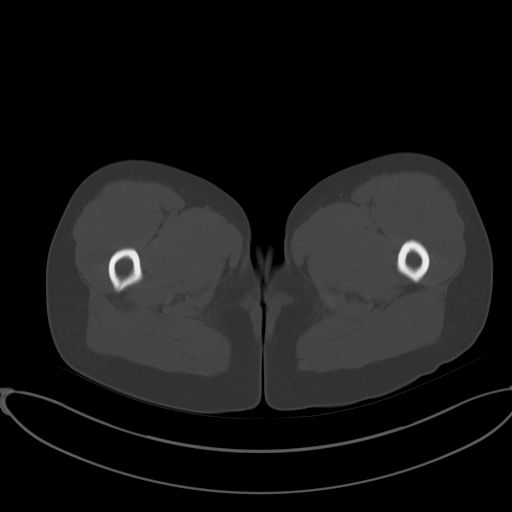
[im 11/85  soft-tissue]
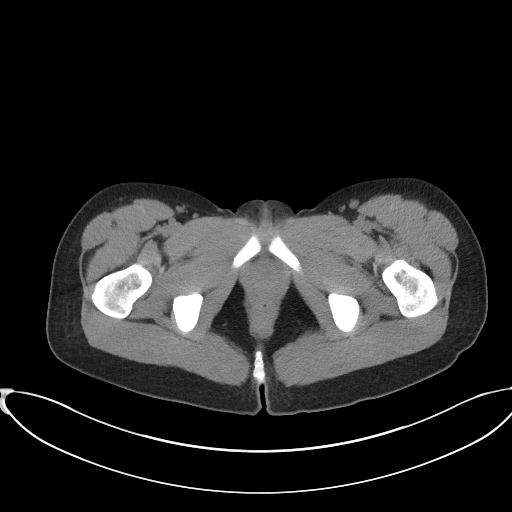
[im 17/85  soft-tissue]
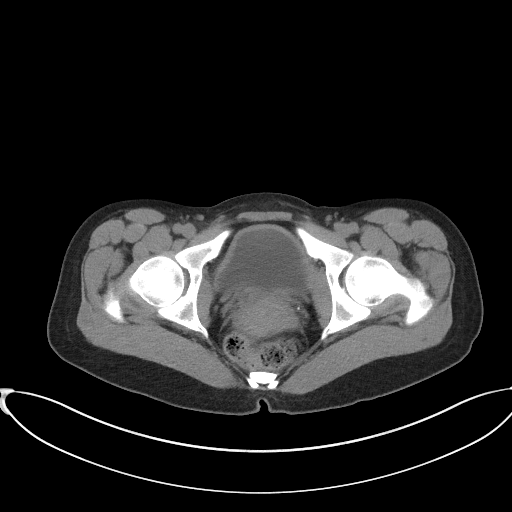
[im 24/85  soft-tissue]
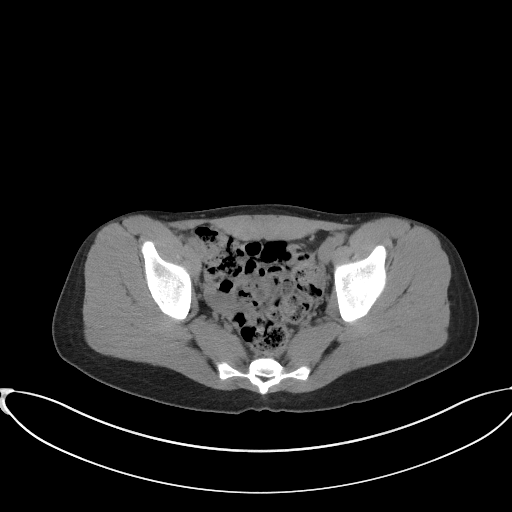
[im 31/85  soft-tissue]
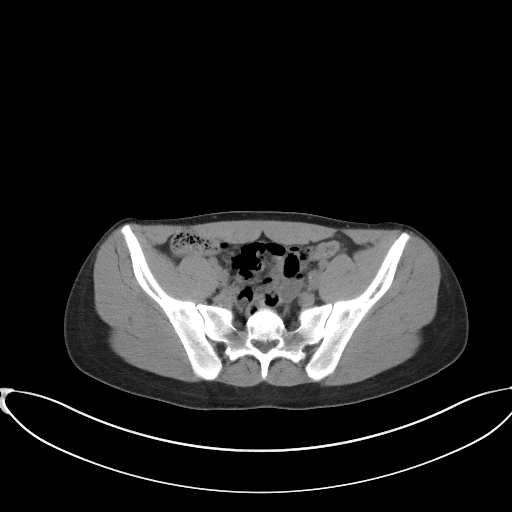
[im 37/85  soft-tissue]
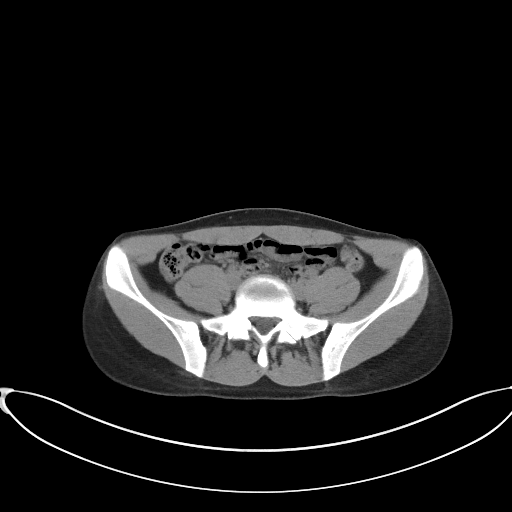
[im 44/85  soft-tissue]
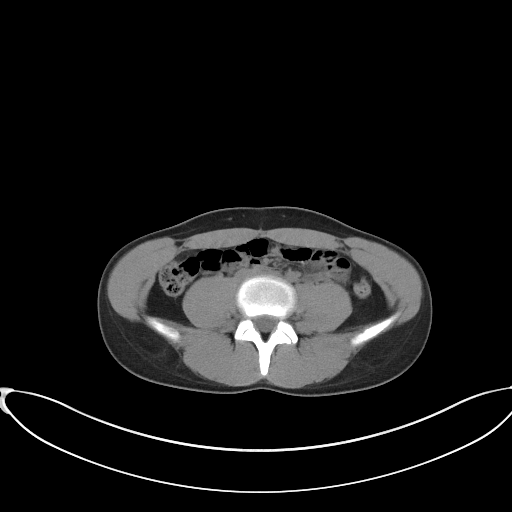
[im 48/85  soft-tissue]
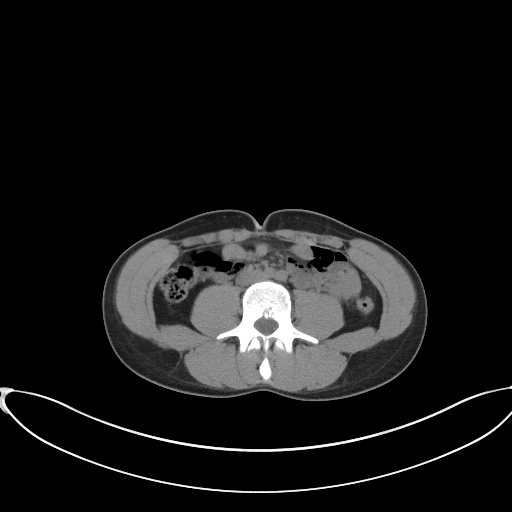
[im 54/85  soft-tissue]
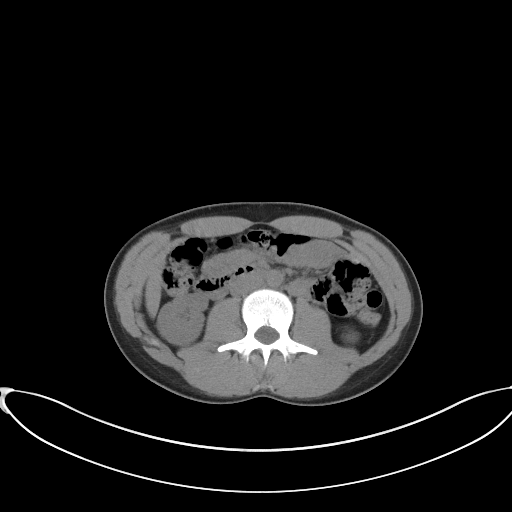
[im 54/85  bone]
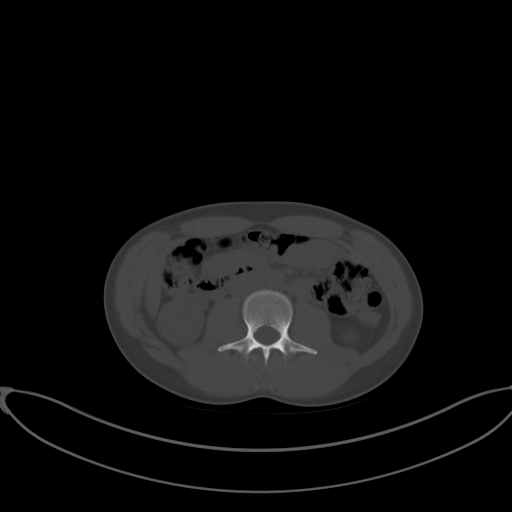
[im 61/85  soft-tissue]
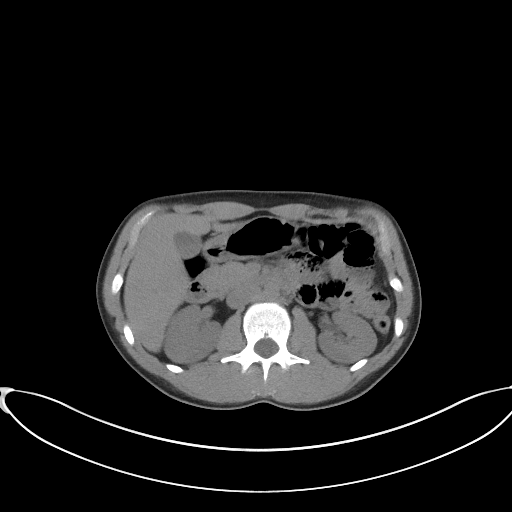
[im 68/85  soft-tissue]
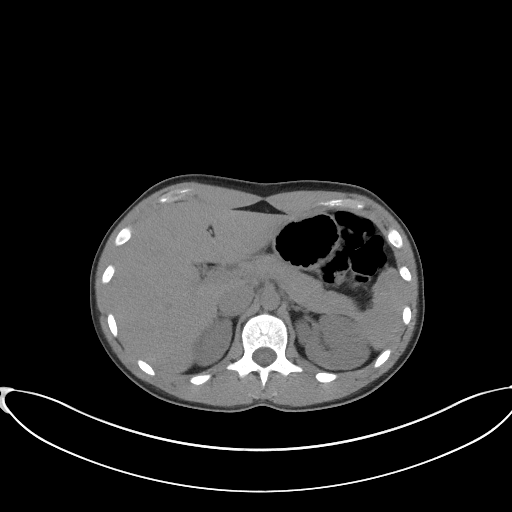
[im 74/85  soft-tissue]
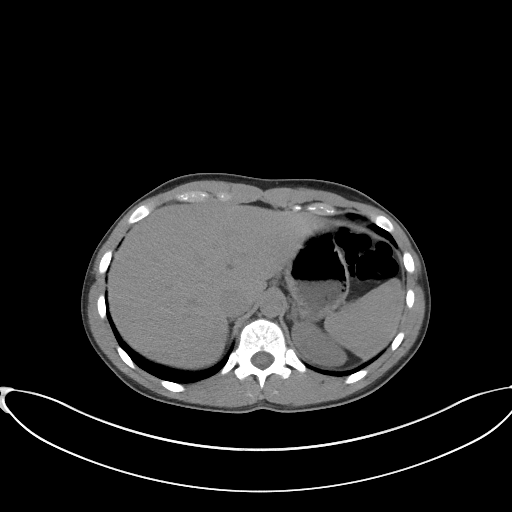
[im 81/85  soft-tissue]
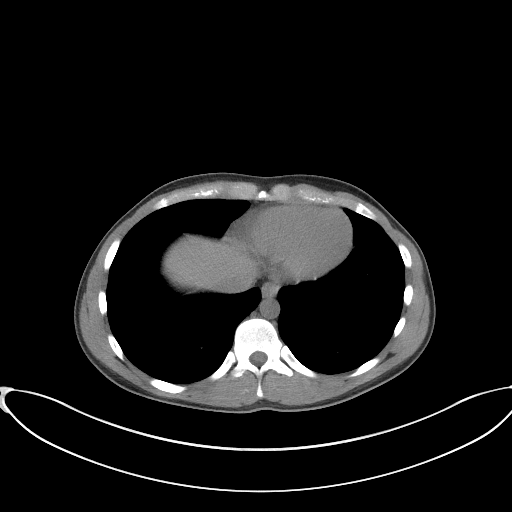

[Series 5: coronal · coronal · 0.74mm/px · 3 of 116 slices shown]
[im 39/116  soft-tissue]
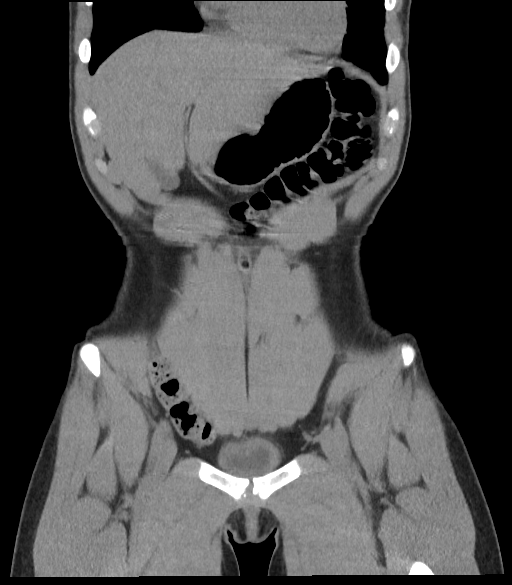
[im 52/116  soft-tissue]
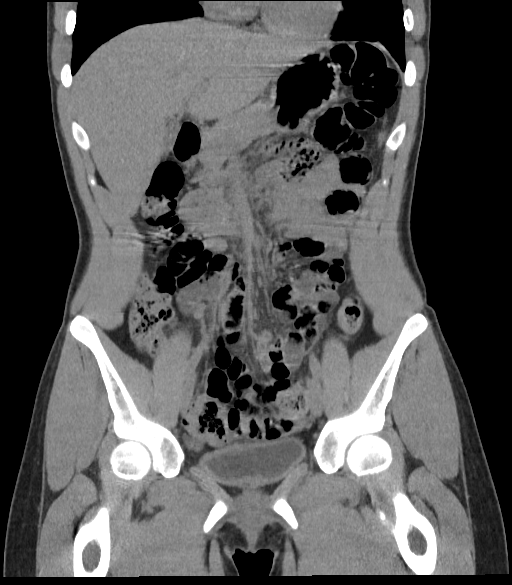
[im 64/116  soft-tissue]
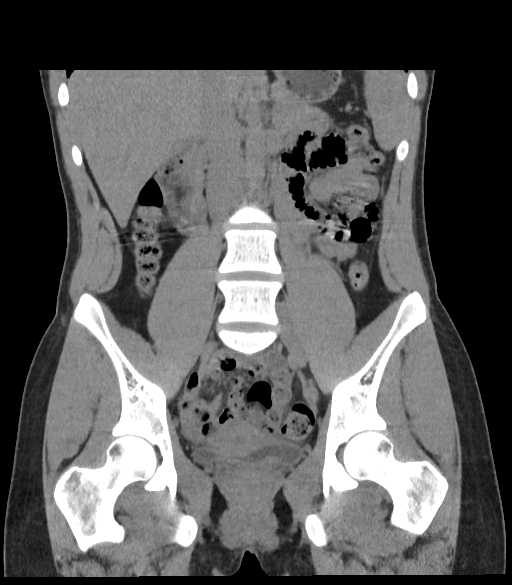

[16 of 46 positions shown; findings below may reference images not displayed]

FINDINGS: Lower chest: No acute abnormality.

Hepatobiliary: No focal liver abnormality. No gallstones,
gallbladder wall thickening, or pericholecystic fluid. No biliary
dilatation.

Pancreas: No focal lesion. Normal pancreatic contour. No surrounding
inflammatory changes. No main pancreatic ductal dilatation.

Spleen: Normal in size without focal abnormality.

Adrenals/Urinary Tract:

No adrenal nodule bilaterally.

Partial visualization of a duplicated left collecting system. No
nephrolithiasis, no hydronephrosis, and no contour-deforming renal
mass. No definite ureterolithiasis or hydroureter.

Mild circumferential urinary bladder wall thickening. Otherwise the
urinary bladder is unremarkable.

Stomach/Bowel: Stomach is within normal limits. No evidence of bowel
wall thickening or dilatation. The appendix is not definitely
identified.

Vascular/Lymphatic: Likely phlebolith noted within the left pelvis
([DATE]). No significant vascular findings are present. No enlarged
abdominal or pelvic lymph nodes.

Reproductive: Uterus and bilateral adnexa are unremarkable.

Other: No intraperitoneal free fluid. No intraperitoneal free gas.
No organized fluid collection.

Musculoskeletal:

No abdominal wall hernia or abnormality.

No suspicious lytic or blastic osseous lesions. No acute displaced
fracture. Multilevel degenerative changes of the spine.
IMPRESSION: 1. Mild circumferential urinary bladder wall thickening. Correlate
with urinalysis for infection.
2. Partial visualization of a duplicated left collecting system. No
associated obstruction or reflux identified.

## 2023-06-01 IMAGING — US US PELVIS COMPLETE TRANSABD/TRANSVAG W DUPLEX
1 series · 14 of 25 positions shown · non-contrast
Comparison: None.

CLINICAL DATA: Pelvic pain

EXAM:
TRANSABDOMINAL AND TRANSVAGINAL ULTRASOUND OF PELVIS
DOPPLER ULTRASOUND OF OVARIES
TECHNIQUE: Study was performed transabdominally to optimize pelvic field of
view evaluation and transvaginally to optimize internal visceral
architecture evaluation.
Color and duplex Doppler ultrasound was utilized to evaluate blood
flow to the ovaries.

[Series 1: us pelvic complete w transvaginal and torsion righ · 14 of 88 slices shown]
[im 1/88]
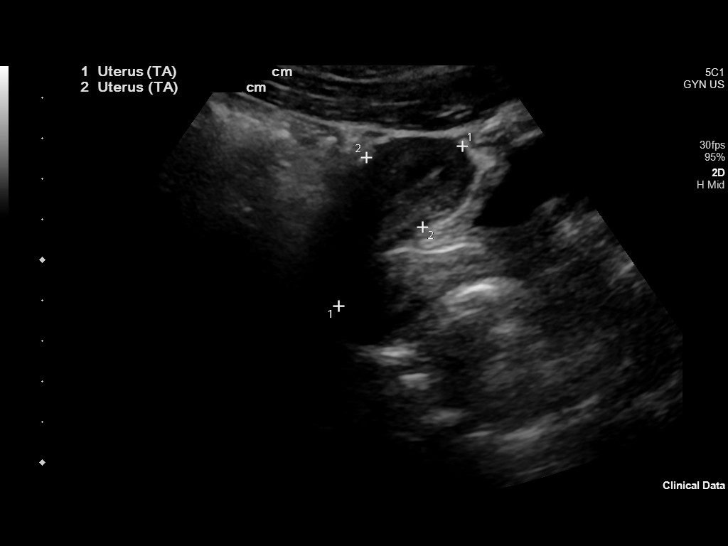
[im 8/88]
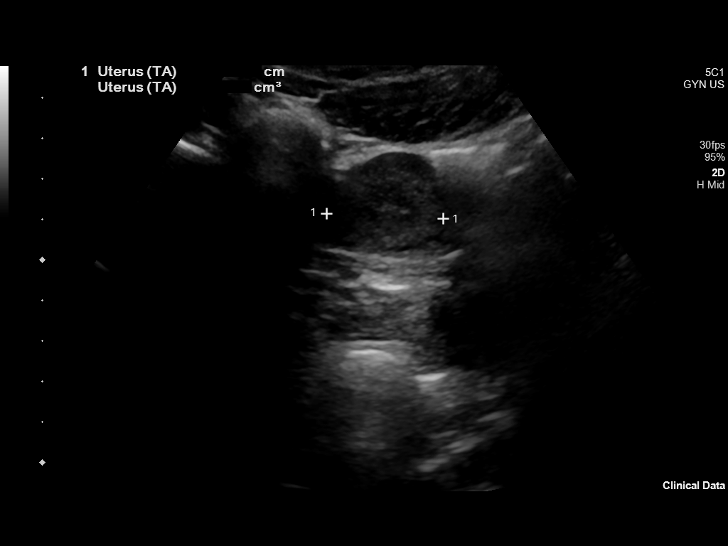
[im 15/88]
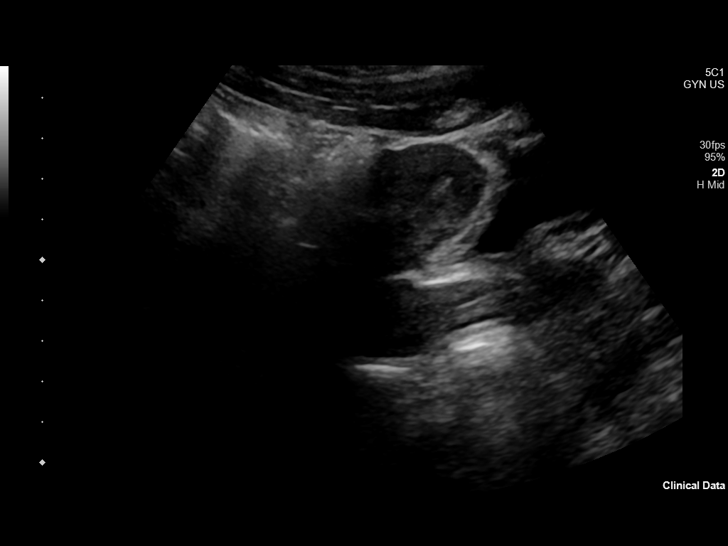
[im 22/88]
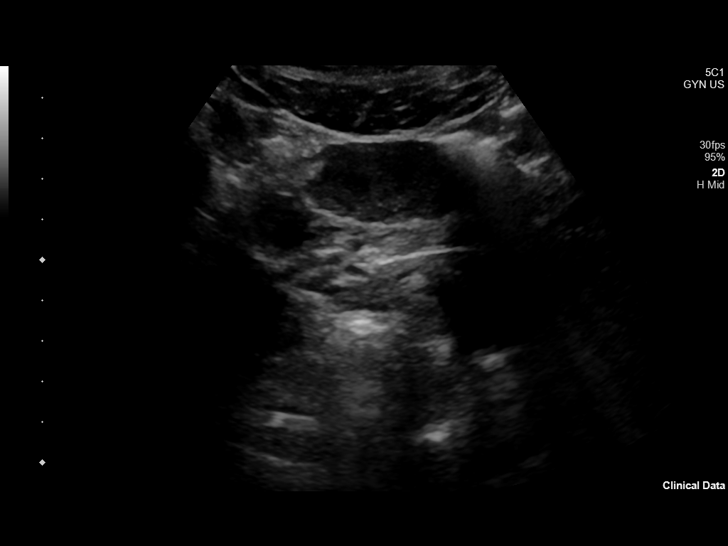
[im 30/88]
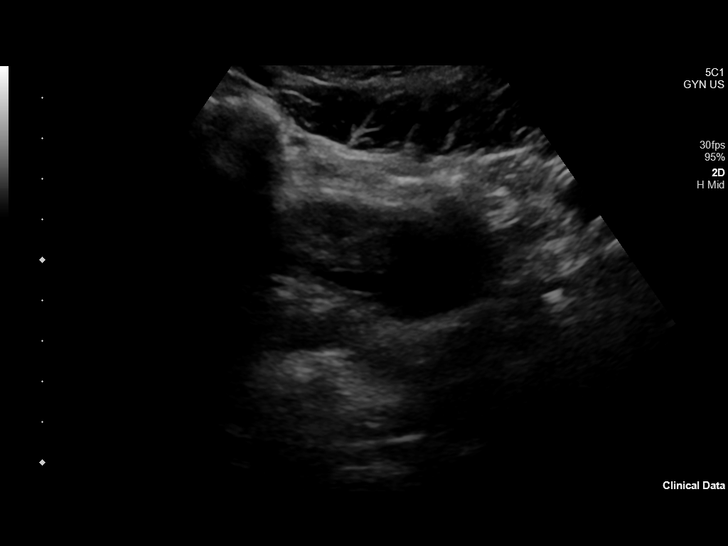
[im 33/88]
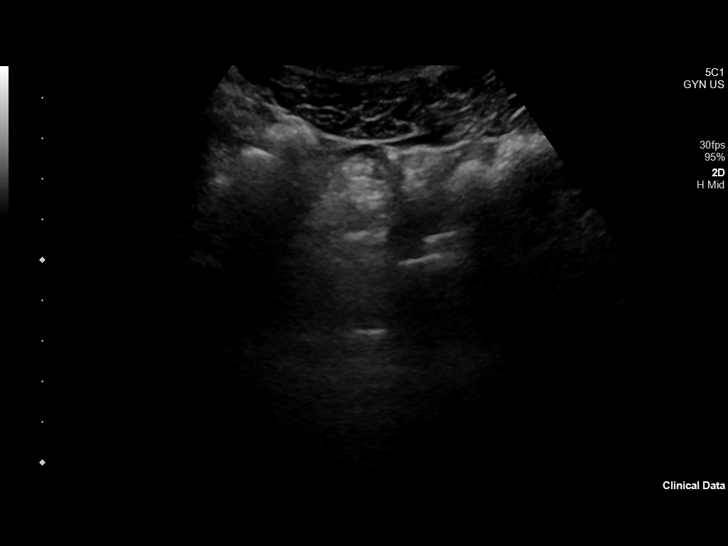
[im 40/88]
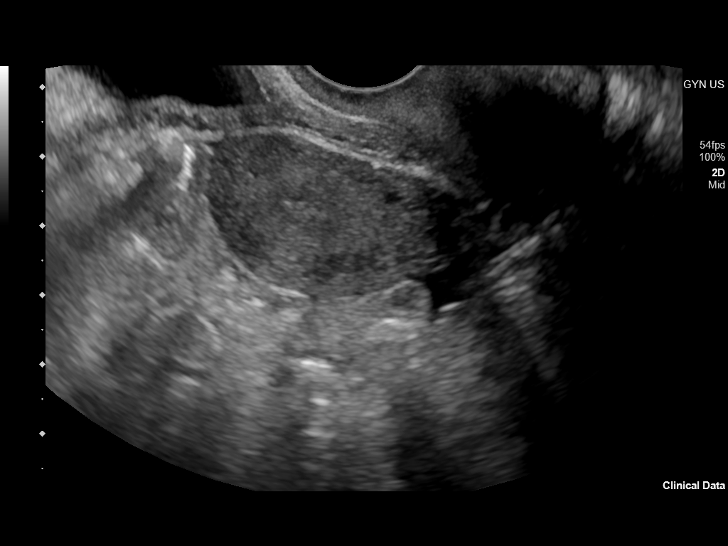
[im 48/88]
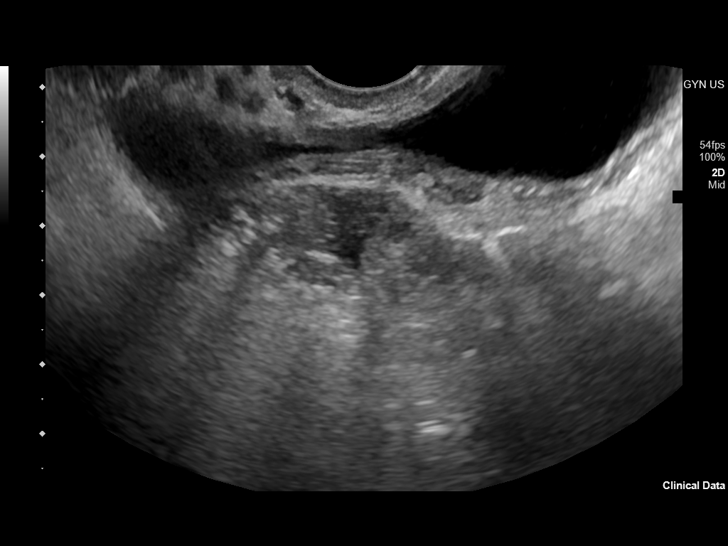
[im 55/88]
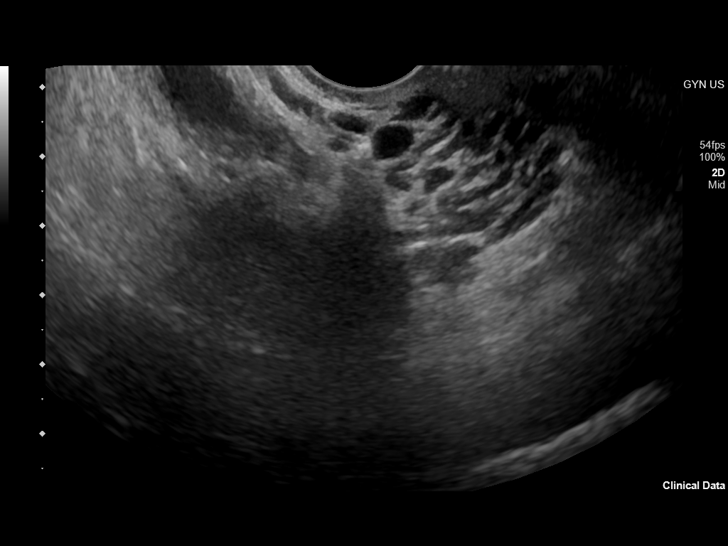
[im 59/88]
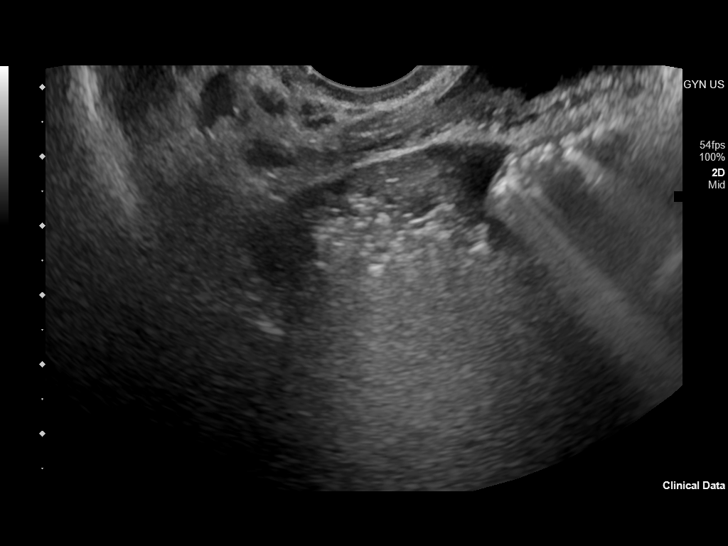
[im 66/88]
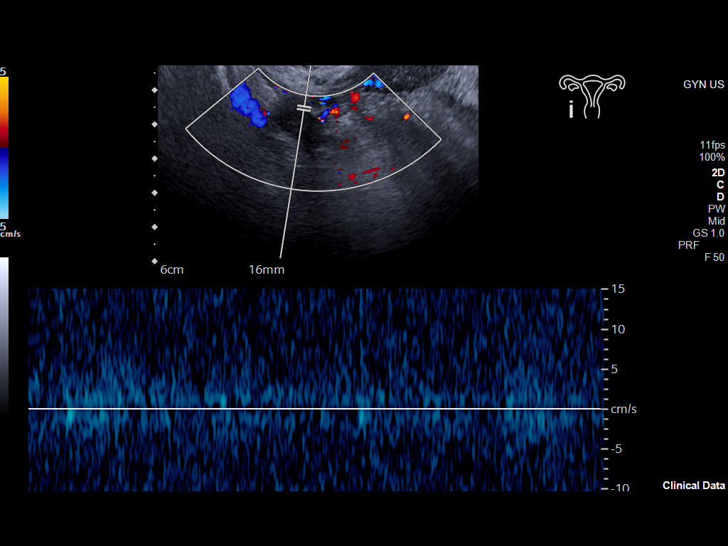
[im 73/88]
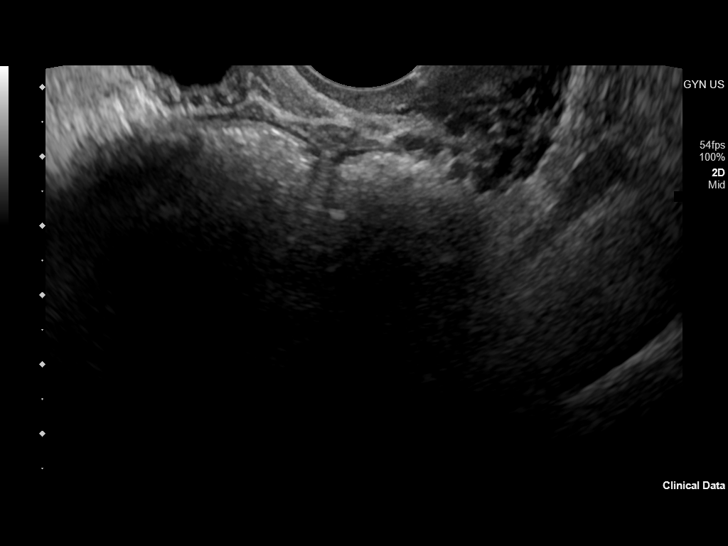
[im 80/88]
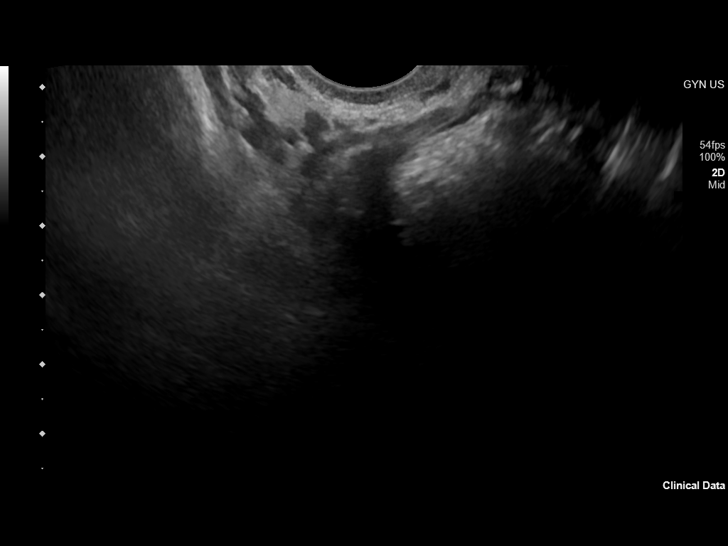
[im 88/88]
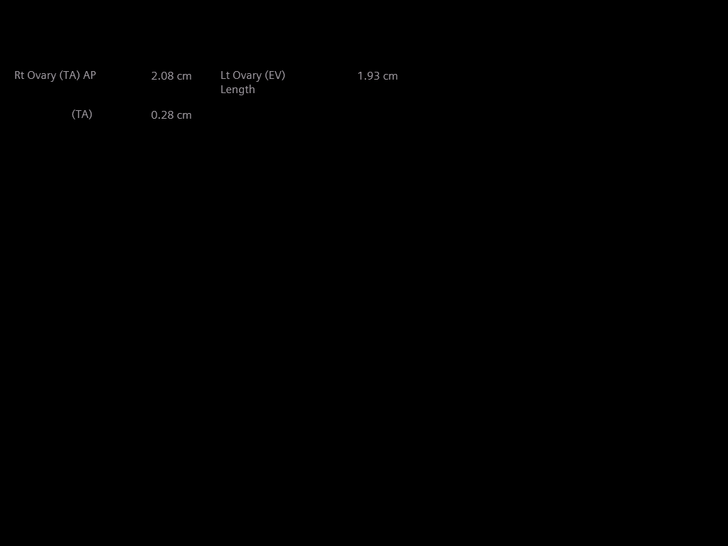

[14 of 25 positions shown; findings below may reference images not displayed]

FINDINGS: Uterus

Measurements: 4.9 x 2.1 x 3.9 cm = volume: 21 mL. No fibroids or
other mass visualized.

Endometrium

Thickness: 3 mm.  No focal abnormality visualized.

Right ovary

Measurements: 2.1 x 1.3 x 1.0 cm = volume: 1.4 mL. Normal
appearance/no adnexal mass.

Left ovary

Measurements: 1.9 x 1.2 x 1.2 cm = volume: 1.4 mL. Normal
appearance/no adnexal mass.

Pulsed Doppler evaluation of both ovaries demonstrates normal
low-resistance arterial and venous waveforms.

Other findings

No abnormal free fluid.
IMPRESSION: Study within normal limits. No intrauterine or extrauterine pelvic
or adnexal mass. No free pelvic fluid. Low resistance waveform in
each ovary. No findings indicative of ovarian torsion on either
side.

## 2023-12-30 ENCOUNTER — Other Ambulatory Visit: Payer: Self-pay | Admitting: Family

## 2023-12-30 ENCOUNTER — Encounter: Payer: Self-pay | Admitting: Family

## 2023-12-30 ENCOUNTER — Telehealth (INDEPENDENT_AMBULATORY_CARE_PROVIDER_SITE_OTHER): Payer: Self-pay | Admitting: Family

## 2023-12-30 ENCOUNTER — Other Ambulatory Visit: Payer: Self-pay

## 2023-12-30 ENCOUNTER — Telehealth: Payer: Self-pay

## 2023-12-30 VITALS — Ht 63.0 in | Wt 121.0 lb

## 2023-12-30 DIAGNOSIS — E162 Hypoglycemia, unspecified: Secondary | ICD-10-CM

## 2023-12-30 DIAGNOSIS — L2489 Irritant contact dermatitis due to other agents: Secondary | ICD-10-CM

## 2023-12-30 DIAGNOSIS — L259 Unspecified contact dermatitis, unspecified cause: Secondary | ICD-10-CM | POA: Insufficient documentation

## 2023-12-30 DIAGNOSIS — E039 Hypothyroidism, unspecified: Secondary | ICD-10-CM

## 2023-12-30 MED ORDER — DEXCOM G7 SENSOR MISC: 2 refills | Status: AC

## 2023-12-30 MED ORDER — TRIAMCINOLONE ACETONIDE 0.5 % EX OINT: 1.0000 | TOPICAL_OINTMENT | Freq: Two times a day (BID) | CUTANEOUS | 2 refills | Status: AC

## 2023-12-30 NOTE — Assessment & Plan Note (Signed)
Started on armour 15mg  by [facility].  Pending TSH, referral to endocrine. Unable to see previous thyroid work up.

## 2023-12-30 NOTE — Patient Instructions (Signed)
Referral to endocrine Let us know if you dont hear back within a week in regards to an appointment being scheduled.   So that you are aware, if you are [facility] MyChart user , please pay attention to your MyChart messages as you may receive a MyChart message with a phone number to call and schedule this test/appointment own your own from our referral coordinator. This is a new process so I do not want you to miss this message.  If you are not a MyChart user, you will receive a phone call.

## 2023-12-30 NOTE — Telephone Encounter (Signed)
Changed labs to [facility] and informed pt also if she needs to get requesitions that she can come by and pick them up

## 2023-12-30 NOTE — Addendum Note (Signed)
Addended by: [place], Cheick Suhr on: 12/30/2023 11:19 AM   Modules accepted: Orders

## 2023-12-30 NOTE — Progress Notes (Unsigned)
Virtual Visit via Video Note  I connected with Michelle Barrett on 12/31/23 at  9:30 AM EST by a video enabled telemedicine application and verified that I am speaking with the correct person using two identifiers. Location patient: home Location provider: work  Persons participating in the virtual visit: patient, provider  I discussed the limitations of evaluation and management by telemedicine and the availability of in person appointments. The patient expressed understanding and agreed to proceed.  HPI: She complains of chronic fatigue for the past year.   She feels tired in the afternoons. She is concerned with low blood sugar. She is eating smaller more frequent meals which has been helpful to sustain appetite, blood.   She wore her step Dad's Dexcom, she has seen blood glucose in 30-40s in the middle of the night. She will eat and blood sugar will increase. She does note that blood sugar will precipitously drop 30 minutes after a meal.   Family h/o hypothyroidism, she is following with [facility] and diagnosed with hypothyroidism.    She was started on Armour 15mg .  She walks 30 minutes daily followed by 30 minutes of abs.   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  She doesn't have a menses due to IUD  She complains of acne since starting Armour.   She is concerned by low testosterone  ROS: See pertinent positives and negatives per HPI.  EXAM:  VITALS per patient if applicable: Ht 5\' 3"  (1.6 m)   Wt 121 lb (54.9 kg)   LMP  (LMP Unknown)   BMI 21.43 kg/m  BP Readings from Last 3 Encounters:  05/21/21 100/64  04/20/21 (!) 97/58  04/17/21 110/70   Wt Readings from Last 3 Encounters:  12/30/23 121 lb (54.9 kg)  10/25/21 128 lb (58.1 kg) (51%, Z= 0.01)*  05/21/21 120 lb (54.4 kg) (36%, Z= -0.35)*   * Growth percentiles are based on CDC (Girls, 2-20 Years) data.    GENERAL: alert, oriented, appears well and  in no acute distress  HEENT: atraumatic, conjunttiva clear, no obvious abnormalities on inspection of external nose and ears  NECK: normal movements of the head and neck  LUNGS: on inspection no signs of respiratory distress, breathing rate appears normal, no obvious gross SOB, gasping or wheezing  CV: no obvious cyanosis  MS: moves all visible extremities without noticeable abnormality  PSYCH/NEURO: pleasant and cooperative, no obvious depression or anxiety, speech and thought processing grossly intact  ASSESSMENT AND PLAN: Hypoglycemia Assessment & Plan: Etiology unclear.  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  Agreed with small frequent meals .ordered Dexcom.  Pending labs, endocrine consult.   Orders: -     Ambulatory referral to Endocrinology  Irritant contact dermatitis due to other agents -     Triamcinolone Acetonide; Apply 1 Application topically 2 (two) times daily. Use sparingly for < 1 week  Dispense: 15 g; Refill: 2  Hypothyroidism, unspecified type Assessment & Plan: Started on armour 15mg  by [facility].  Pending TSH, referral to endocrine. Unable to see previous thyroid work up.       -we discussed possible serious and likely etiologies, options for evaluation and workup, limitations of telemedicine visit vs in person visit, treatment, treatment risks and precautions. Pt prefers to treat via telemedicine empirically rather then risking or undertaking an in person visit at this moment.    I discussed the assessment  and treatment plan with the patient. The patient was provided an opportunity to ask questions and all were answered. The patient agreed with the plan and demonstrated an understanding of the instructions.   The patient was advised to call back or seek an in-person evaluation if the symptoms worsen or if the condition fails to improve as anticipated.  Advised if desired AVS can be mailed or viewed via MyChart  if Mychart user.   Rennie Plowman, FNP

## 2023-12-30 NOTE — Telephone Encounter (Signed)
Copied from CRM [id number]. Topic: Appointments - Appointment Scheduling >> Dec 30, 2023 10:29 AM Truddie Crumble wrote: Patient/patient representative is calling to schedule an appointment. Refer to attachments for appointment information. Patient called wanting to see if she can get her labs done at [facility]

## 2023-12-30 NOTE — Telephone Encounter (Signed)
Printed off labs and placed in provider folder for review

## 2023-12-30 NOTE — Assessment & Plan Note (Addendum)
Etiology unclear.  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  Agreed with small frequent meals .ordered Dexcom.  Pending labs, endocrine consult.

## 2024-01-13 ENCOUNTER — Encounter: Payer: Self-pay | Admitting: *Deleted

## 2024-01-13 ENCOUNTER — Encounter: Payer: Self-pay | Admitting: Family

## 2024-01-13 ENCOUNTER — Telehealth: Payer: Self-pay

## 2024-01-13 NOTE — Telephone Encounter (Signed)
 Copied from CRM [id number]. Topic: General - Other >> Jan 13, 2024  4:34 PM [place] J wrote: Reason for CRM: Patient returning call back from [place], [place] regarding lab work. Patient stated that she did get that done recently and that [facility] should have her labs.

## 2024-01-13 NOTE — Telephone Encounter (Signed)
 Copied from CRM [id number]. Topic: General - Other >> Jan 13, 2024  1:25 PM Irine Seal wrote: Reason for CRM: labs faxed  [facility] called in regards to referral # [id number]  she stated she needs most current labs proving patient is hypoglycemic  fax # [phone]

## 2024-01-14 NOTE — Telephone Encounter (Signed)
 Noted.

## 2024-01-27 LAB — COMPREHENSIVE METABOLIC PANEL
ALT: 18 IU/L (ref 0–32)
AST: 27 IU/L (ref 0–40)
Albumin: 4.1 g/dL (ref 4.0–5.0)
Alkaline Phosphatase: 76 IU/L (ref 44–121)
BUN/Creatinine Ratio: 22 (ref 9–23)
BUN: 18 mg/dL (ref 6–20)
Bilirubin Total: 0.4 mg/dL (ref 0.0–1.2)
CO2: 24 mmol/L (ref 20–29)
Calcium: 9.2 mg/dL (ref 8.7–10.2)
Chloride: 106 mmol/L (ref 96–106)
Creatinine, Ser: 0.83 mg/dL (ref 0.57–1.00)
Globulin, Total: 1.9 g/dL (ref 1.5–4.5)
Glucose: 78 mg/dL (ref 70–99)
Potassium: 4.3 mmol/L (ref 3.5–5.2)
Sodium: 141 mmol/L (ref 134–144)
Total Protein: 6 g/dL (ref 6.0–8.5)
eGFR: 103 mL/min/{1.73_m2} (ref >59)

## 2024-01-27 LAB — PCOS DIAGNOSTIC PROFILE
17-Alpha-Hydroxyprogesterone: 45 ng/dL
ANTI-MULLERIAN HORMONE (AMH): 2.2 ng/mL
DHEA-Sulfate, LCMS: 82 ug/dL
Estradiol, Serum, MS: 24 pg/mL
Follicle Stimulating Hormone: 8.4 m[IU]/mL
Free Testosterone, Serum: 1.5 pg/mL
Luteinizing Hormone (LH) ECL: 9.4 m[IU]/mL
Prolactin: 11.8 ng/mL
Sex Hormone Binding Globulin: 44.7 nmol/L
TSH: 1.2 uU/mL
Testosterone, Serum (Total): 14 ng/dL
Testosterone-% Free: 1.1 %

## 2024-01-27 LAB — HEMOGLOBIN A1C
Est. average glucose Bld gHb Est-mCnc: 91 mg/dL
Hgb A1c MFr Bld: 4.8 % (ref 4.8–5.6)

## 2024-01-27 LAB — CBC WITH DIFFERENTIAL/PLATELET
Basophils Absolute: 0.1 10*3/uL (ref 0.0–0.2)
Basos: 1 %
EOS (ABSOLUTE): 0.8 10*3/uL — ABNORMAL HIGH (ref 0.0–0.4)
Eos: 13 %
Hematocrit: 41.1 % (ref 34.0–46.6)
Hemoglobin: 13.4 g/dL (ref 11.1–15.9)
Immature Grans (Abs): 0 10*3/uL (ref 0.0–0.1)
Immature Granulocytes: 0 %
Lymphocytes Absolute: 2 10*3/uL (ref 0.7–3.1)
Lymphs: 32 %
MCH: 30.8 pg (ref 26.6–33.0)
MCHC: 32.6 g/dL (ref 31.5–35.7)
MCV: 95 fL (ref 79–97)
Monocytes Absolute: 0.4 10*3/uL (ref 0.1–0.9)
Monocytes: 6 %
Neutrophils Absolute: 3.1 10*3/uL (ref 1.4–7.0)
Neutrophils: 48 %
Platelets: 297 10*3/uL (ref 150–450)
RBC: 4.35 x10E6/uL (ref 3.77–5.28)
RDW: 11.9 % (ref 11.7–15.4)
WBC: 6.4 10*3/uL (ref 3.4–10.8)

## 2024-01-27 LAB — ANTI-ISLET CELL ANTIBODY: Islet Cell Ab: NEGATIVE

## 2024-01-27 LAB — GLUTAMIC ACID DECARBOXYLASE AUTO ABS: Glutamic Acid Decarb Ab: <5 U/mL (ref 0.0–5.0)

## 2024-01-27 LAB — C-PEPTIDE: C-Peptide: 0.8 ng/mL — ABNORMAL LOW (ref 1.1–4.4)

## 2024-01-27 LAB — INSULIN ANTIBODIES, BLOOD

## 2024-02-04 ENCOUNTER — Telehealth: Payer: Self-pay | Admitting: Family

## 2024-02-04 ENCOUNTER — Encounter: Payer: Self-pay | Admitting: Family

## 2024-02-04 NOTE — Telephone Encounter (Signed)
 Michelle Barrett pool  Please download dexcom data and sch appt with pt to review  Call pt  [facility] declined referral  Ask to leave message below for Dr Beatrix Fetters   I had referred pt to her due to night hypoglycemia 40-50's and precipitous drops of blood sugar 30 minutes after a meal . 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   I wanted to rule out hyperinsulinism, DM type 1.  Please ask her to review labs from 01/13/24  Insulin normal.   Cpeptide is low.   She is wearing dexcom now.    Would she recommend obtaining cortisol level? She had declined consult.   She may call my cell if easier [phone]

## 2024-02-05 ENCOUNTER — Telehealth: Payer: Self-pay

## 2024-02-05 NOTE — Telephone Encounter (Signed)
 LVM to call back to schedule f/up appt  evn though pt has seen results please schedule when pt calls back

## 2024-02-05 NOTE — Telephone Encounter (Signed)
 Called [facility] and spoke to [place] she took the following message and she stated that she would give to Dr Beatrix Fetters   Endocrine declined referral  Ask to leave message below for Dr Beatrix Fetters     I had referred pt to her due to night hypoglycemia 40-50's and precipitous drops of blood sugar 30 minutes after a meal . 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    I wanted to rule out hyperinsulinism, DM type 1.   Please ask her to review labs from 01/13/24   Insulin normal.    Cpeptide is low.    She is wearing dexcom now.      Would she recommend obtaining cortisol level? She had declined consult.    She may call my cell if easier [phone]

## 2024-02-11 NOTE — Telephone Encounter (Signed)
 Spoke with [facility] and returned Michelle Barrett's phone call  Informed Dr Donald Prose will see pt and they are able to see referral  Sent mychart message to patient
